# Patient Record
Sex: Female | Born: 1975 | Race: White | Hispanic: No | Marital: Married | Smoking: Former smoker
Health system: Southern US, Community
[De-identification: ages and names within clinical notes are randomized; demographics above are authoritative.]

## PROBLEM LIST (undated history)

## (undated) DIAGNOSIS — Z789 Other specified health status: Secondary | ICD-10-CM

## (undated) HISTORY — PX: OOPHORECTOMY: SHX86

## (undated) HISTORY — PX: PARTIAL HYSTERECTOMY: SHX80

---

## 2005-03-15 ENCOUNTER — Emergency Department: Payer: Self-pay | Admitting: Emergency Medicine

## 2005-07-24 ENCOUNTER — Observation Stay: Payer: Self-pay

## 2005-07-30 ENCOUNTER — Inpatient Hospital Stay: Payer: Self-pay

## 2006-01-07 ENCOUNTER — Emergency Department: Payer: Self-pay | Admitting: Emergency Medicine

## 2008-04-27 ENCOUNTER — Emergency Department: Payer: Self-pay | Admitting: Unknown Physician Specialty

## 2008-08-20 ENCOUNTER — Emergency Department: Payer: Self-pay | Admitting: Emergency Medicine

## 2008-08-29 ENCOUNTER — Ambulatory Visit: Payer: Self-pay | Admitting: Obstetrics and Gynecology

## 2008-09-04 ENCOUNTER — Ambulatory Visit: Payer: Self-pay | Admitting: Obstetrics and Gynecology

## 2008-11-26 ENCOUNTER — Emergency Department: Payer: Self-pay | Admitting: Emergency Medicine

## 2011-02-12 ENCOUNTER — Emergency Department: Payer: Self-pay | Admitting: Emergency Medicine

## 2012-08-11 ENCOUNTER — Emergency Department: Payer: Self-pay | Admitting: Emergency Medicine

## 2016-08-08 ENCOUNTER — Encounter (HOSPITAL_COMMUNITY): Payer: Self-pay | Admitting: Emergency Medicine

## 2016-08-08 ENCOUNTER — Emergency Department (HOSPITAL_COMMUNITY): Payer: No Typology Code available for payment source

## 2016-08-08 ENCOUNTER — Emergency Department (HOSPITAL_COMMUNITY)
Admission: EM | Admit: 2016-08-08 | Discharge: 2016-08-08 | Disposition: A | Discharge status: Home / Self Care | Payer: No Typology Code available for payment source | Attending: Emergency Medicine | Admitting: Emergency Medicine

## 2016-08-08 DIAGNOSIS — S0990XA Unspecified injury of head, initial encounter: Secondary | ICD-10-CM | POA: Diagnosis not present

## 2016-08-08 DIAGNOSIS — Y92410 Unspecified street and highway as the place of occurrence of the external cause: Secondary | ICD-10-CM | POA: Insufficient documentation

## 2016-08-08 DIAGNOSIS — S299XXA Unspecified injury of thorax, initial encounter: Secondary | ICD-10-CM | POA: Insufficient documentation

## 2016-08-08 DIAGNOSIS — Y999 Unspecified external cause status: Secondary | ICD-10-CM | POA: Diagnosis not present

## 2016-08-08 DIAGNOSIS — Y939 Activity, unspecified: Secondary | ICD-10-CM | POA: Insufficient documentation

## 2016-08-08 DIAGNOSIS — F17200 Nicotine dependence, unspecified, uncomplicated: Secondary | ICD-10-CM | POA: Insufficient documentation

## 2016-08-08 DIAGNOSIS — N9489 Other specified conditions associated with female genital organs and menstrual cycle: Secondary | ICD-10-CM | POA: Insufficient documentation

## 2016-08-08 DIAGNOSIS — S3991XA Unspecified injury of abdomen, initial encounter: Secondary | ICD-10-CM | POA: Diagnosis not present

## 2016-08-08 DIAGNOSIS — S298XXA Other specified injuries of thorax, initial encounter: Secondary | ICD-10-CM

## 2016-08-08 LAB — CBC WITH DIFFERENTIAL/PLATELET
Basophils Absolute: 0 10*3/uL (ref 0.0–0.1)
Basophils Relative: 0 %
EOS PCT: 4 %
Eosinophils Absolute: 0.5 10*3/uL (ref 0.0–0.7)
HEMATOCRIT: 45.8 % (ref 36.0–46.0)
HEMOGLOBIN: 15.4 g/dL — AB (ref 12.0–15.0)
LYMPHS ABS: 4.5 10*3/uL — AB (ref 0.7–4.0)
LYMPHS PCT: 37 %
MCH: 32.6 pg (ref 26.0–34.0)
MCHC: 33.6 g/dL (ref 30.0–36.0)
MCV: 97 fL (ref 78.0–100.0)
MONOS PCT: 6 %
Monocytes Absolute: 0.7 10*3/uL (ref 0.1–1.0)
Neutro Abs: 6.5 10*3/uL (ref 1.7–7.7)
Neutrophils Relative %: 53 %
PLATELETS: 360 10*3/uL (ref 150–400)
RBC: 4.72 MIL/uL (ref 3.87–5.11)
RDW: 14 % (ref 11.5–15.5)
WBC: 12.2 10*3/uL — AB (ref 4.0–10.5)

## 2016-08-08 LAB — COMPREHENSIVE METABOLIC PANEL
ALK PHOS: 86 U/L (ref 38–126)
ALT: 21 U/L (ref 14–54)
AST: 23 U/L (ref 15–41)
Albumin: 4 g/dL (ref 3.5–5.0)
Anion gap: 11 (ref 5–15)
BILIRUBIN TOTAL: 0.4 mg/dL (ref 0.3–1.2)
CALCIUM: 9.3 mg/dL (ref 8.9–10.3)
CO2: 21 mmol/L — ABNORMAL LOW (ref 22–32)
CREATININE: 0.49 mg/dL (ref 0.44–1.00)
Chloride: 104 mmol/L (ref 101–111)
Glucose, Bld: 102 mg/dL — ABNORMAL HIGH (ref 65–99)
Potassium: 3.5 mmol/L (ref 3.5–5.1)
Sodium: 136 mmol/L (ref 135–145)
Total Protein: 6.9 g/dL (ref 6.5–8.1)

## 2016-08-08 LAB — I-STAT BETA HCG BLOOD, ED (MC, WL, AP ONLY)

## 2016-08-08 MED ORDER — IOPAMIDOL (ISOVUE-300) INJECTION 61%
INTRAVENOUS | Status: AC
  Administered 2016-08-08: 100 mL
  Filled 2016-08-08: qty 100

## 2016-08-08 MED ORDER — NAPROXEN 375 MG PO TABS: 375.0000 mg | ORAL_TABLET | Freq: Two times a day (BID) | ORAL | 0 refills | Status: DC

## 2016-08-08 MED ORDER — HYDROMORPHONE HCL 1 MG/ML IJ SOLN
1.0000 mg | Freq: Once | INTRAMUSCULAR | Status: AC
  Administered 2016-08-08: 1 mg via INTRAVENOUS
  Filled 2016-08-08: qty 1

## 2016-08-08 MED ORDER — HYDROCODONE-ACETAMINOPHEN 5-325 MG PO TABS: 1.0000 | ORAL_TABLET | ORAL | 0 refills | Status: DC | PRN

## 2016-08-08 MED ORDER — ONDANSETRON HCL 4 MG/2ML IJ SOLN
4.0000 mg | Freq: Once | INTRAMUSCULAR | Status: AC
  Administered 2016-08-08: 4 mg via INTRAVENOUS
  Filled 2016-08-08: qty 2

## 2016-08-08 MED ORDER — SODIUM CHLORIDE 0.9 % IV BOLUS (SEPSIS)
1000.0000 mL | Freq: Once | INTRAVENOUS | Status: AC
  Administered 2016-08-08: 1000 mL via INTRAVENOUS

## 2016-08-08 MED ORDER — CYCLOBENZAPRINE HCL 10 MG PO TABS: 10.0000 mg | ORAL_TABLET | Freq: Three times a day (TID) | ORAL | 0 refills | Status: DC | PRN

## 2016-08-08 NOTE — ED Notes (Signed)
Discharge instructions and prescriptions reviewed 

## 2016-08-08 NOTE — ED Notes (Signed)
Nurse drawing labs. 
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
## 2016-08-08 NOTE — ED Notes (Signed)
Pt transported to CT ?

## 2017-05-15 ENCOUNTER — Encounter: Payer: Self-pay | Admitting: Emergency Medicine

## 2017-05-15 ENCOUNTER — Emergency Department
Admission: EM | Admit: 2017-05-15 | Discharge: 2017-05-16 | Disposition: A | Discharge status: Home / Self Care | Payer: Self-pay | Attending: Emergency Medicine | Admitting: Emergency Medicine

## 2017-05-15 DIAGNOSIS — Z23 Encounter for immunization: Secondary | ICD-10-CM | POA: Insufficient documentation

## 2017-05-15 DIAGNOSIS — Z79899 Other long term (current) drug therapy: Secondary | ICD-10-CM | POA: Insufficient documentation

## 2017-05-15 DIAGNOSIS — Y929 Unspecified place or not applicable: Secondary | ICD-10-CM | POA: Insufficient documentation

## 2017-05-15 DIAGNOSIS — W25XXXA Contact with sharp glass, initial encounter: Secondary | ICD-10-CM | POA: Insufficient documentation

## 2017-05-15 DIAGNOSIS — Y9389 Activity, other specified: Secondary | ICD-10-CM | POA: Insufficient documentation

## 2017-05-15 DIAGNOSIS — S61012A Laceration without foreign body of left thumb without damage to nail, initial encounter: Secondary | ICD-10-CM | POA: Insufficient documentation

## 2017-05-15 DIAGNOSIS — Y999 Unspecified external cause status: Secondary | ICD-10-CM | POA: Insufficient documentation

## 2017-05-15 NOTE — ED Triage Notes (Signed)
Pt in via POV with laceration to base of left thumb, pt reports she was washing dishes, cutting finger on broken glass.  Bleeding controlled at this time.  NAD noted at this time.

## 2017-05-16 MED ORDER — TETANUS-DIPHTH-ACELL PERTUSSIS 5-2.5-18.5 LF-MCG/0.5 IM SUSP
0.5000 mL | Freq: Once | INTRAMUSCULAR | Status: AC
  Administered 2017-05-16: 0.5 mL via INTRAMUSCULAR
  Filled 2017-05-16: qty 0.5

## 2017-05-16 NOTE — ED Provider Notes (Signed)
[facility] Emergency Department Provider Note   ____________________________________________   First MD Initiated Contact with Patient 05/15/17 2350     (approximate)  I have reviewed the triage vital signs and the nursing notes.   HISTORY  Chief Complaint Extremity Laceration    HPI Deborah Carney is a 41 y.o. female who came into the hospital today with a laceration to her thumb. The patient reports that she was washing dishes and the glass broke while it was in her hand. It cut the base of her left thumb on the dorsal side. The patient is unsure of her last tetanus. She states that it would not stop bleeding. The patient rates her pain 8-10 out of 10 in intensity. She didn't take anything for pain and she came in for repair.   History reviewed. No pertinent past medical history.  There are no active problems to display for this patient.   Past Surgical History:  Procedure Laterality Date  . OOPHORECTOMY Left     Prior to Admission medications   Medication Sig Start Date End Date Taking? Authorizing Provider  cyclobenzaprine (FLEXERIL) 10 MG tablet Take 1 tablet (10 mg total) by mouth 3 (three) times daily as needed for muscle spasms. 08/08/16   Gilda Crease, MD  HYDROcodone-acetaminophen (NORCO/VICODIN) 5-325 MG tablet Take 1-2 tablets by mouth every 4 (four) hours as needed for moderate pain. 08/08/16   Gilda Crease, MD  naproxen (NAPROSYN) 375 MG tablet Take 1 tablet (375 mg total) by mouth 2 (two) times daily. 08/08/16   Gilda Crease, MD    Allergies Patient has no known allergies.  No family history on file.  Social History Social History  Substance Use Topics  . Smoking status: Current Some Day Smoker  . Smokeless tobacco: Never Used  . Alcohol use No    Review of Systems  Constitutional: No fever/chills Eyes: No visual changes. ENT: No sore throat. Cardiovascular: Denies chest pain. Respiratory:  Denies shortness of breath. Gastrointestinal: No abdominal pain.  No nausea, no vomiting.  No diarrhea.  No constipation. Genitourinary: Negative for dysuria. Musculoskeletal: Negative for back pain. Skin: Laceration to the base of left thumb Neurological: Negative for headaches, focal weakness or numbness.   ____________________________________________   PHYSICAL EXAM:  VITAL SIGNS: ED Triage Vitals  Enc Vitals Group     BP 05/15/17 2128 110/69     Pulse Rate 05/15/17 2128 92     Resp 05/15/17 2128 16     Temp 05/15/17 2128 98.3 F (36.8 C)     Temp Source 05/15/17 2128 Oral     SpO2 05/15/17 2128 100 %     Weight 05/15/17 2129 132 lb (59.9 kg)     Height 05/15/17 2129 5\' 2"  (1.575 m)     Head Circumference --      Peak Flow --      Pain Score 05/15/17 2127 6     Pain Loc --      Pain Edu? --      Excl. in GC? --     Constitutional: Alert and oriented. Well appearing and in Mild distress. Eyes: Conjunctivae are normal. PERRL. EOMI. Head: Atraumatic. Nose: No congestion/rhinnorhea. Mouth/Throat: Mucous membranes are moist.  Oropharynx non-erythematous. Cardiovascular: Normal rate, regular rhythm. Grossly normal heart sounds.  Good peripheral circulation. Respiratory: Normal respiratory effort.  No retractions. Lungs CTAB. Gastrointestinal: Soft and nontender. No distention. Positive bowel sounds. Musculoskeletal: No lower extremity tenderness nor edema.  Neurologic:  Normal speech and language.  Skin:  Skin is warm, dry 2.5 cm laceration to the base of the dorsal left thumb. Laceration is a flap with the edges that approximate well and cover the wound. No active bleeding at this time, no erythema, the patient has good capillary refill of her thumb and has good motion of her thumb.Marland Kitchen. Psychiatric: Mood and affect are normal.   ____________________________________________   LABS (all labs ordered are listed, but only abnormal results are displayed)  Labs Reviewed - No  data to display ____________________________________________  EKG  none ____________________________________________  RADIOLOGY  No results found.  ____________________________________________   PROCEDURES  Procedure(s) performed: please, see procedure note(s).  Marland Kitchen..Laceration Repair Date/Time: 05/16/2017 12:28 AM Performed by: Rebecka ApleyWEBSTER, ALLISON P Authorized by: Rebecka ApleyWEBSTER, ALLISON P   Consent:    Consent obtained:  Verbal   Consent given by:  Patient Anesthesia (see MAR for exact dosages):    Anesthesia method:  None Laceration details:    Location:  Finger   Finger location:  L thumb   Length (cm):  2 Repair type:    Repair type:  Simple Exploration:    Contaminated: no   Treatment:    Area cleansed with:  Soap and water   Amount of cleaning:  Standard Skin repair:    Repair method:  Tissue adhesive Post-procedure details:    Dressing:  Bulky dressing   Patient tolerance of procedure:  Tolerated well, no immediate complications    Critical Care performed: No  ____________________________________________   INITIAL IMPRESSION / ASSESSMENT AND PLAN / ED COURSE  Pertinent labs & imaging results that were available during my care of the patient were reviewed by me and considered in my medical decision making (see chart for details).  This is a 41 year old female who comes into the hospital today with a laceration to the base of her left thumb. The wound is a flap and the edges are approximated well. I will choose to glue the wound as it was still remained closed and should heal well. After putting Dermabond on the wound I did place a splint out to the patient's finger so she cannot bend it while it is healing. I instructed the patient that should she have any swelling any drainage or any worsening condition she should return to the emergency department. The patient be discharged home to follow-up with her primary care physician.       ____________________________________________   FINAL CLINICAL IMPRESSION(S) / ED DIAGNOSES  Final diagnoses:  Laceration of left thumb without foreign body without damage to nail, initial encounter      NEW MEDICATIONS STARTED DURING THIS VISIT:  Discharge Medication List as of 05/16/2017 12:37 AM       Note:  This document was prepared using Dragon voice recognition software and may include unintentional dictation errors.    Rebecka ApleyWebster, Allison P, MD 05/16/17 [phone]

## 2017-05-16 NOTE — Discharge Instructions (Signed)
Please follow up with your primary care physician. Please return with any worsened condition or any other concern

## 2017-09-07 ENCOUNTER — Emergency Department: Payer: BLUE CROSS/BLUE SHIELD

## 2017-09-07 ENCOUNTER — Emergency Department
Admission: EM | Admit: 2017-09-07 | Discharge: 2017-09-07 | Disposition: A | Discharge status: Home / Self Care | Payer: BLUE CROSS/BLUE SHIELD | Attending: Emergency Medicine | Admitting: Emergency Medicine

## 2017-09-07 ENCOUNTER — Other Ambulatory Visit: Payer: Self-pay

## 2017-09-07 DIAGNOSIS — Z87891 Personal history of nicotine dependence: Secondary | ICD-10-CM | POA: Insufficient documentation

## 2017-09-07 DIAGNOSIS — J181 Lobar pneumonia, unspecified organism: Secondary | ICD-10-CM | POA: Insufficient documentation

## 2017-09-07 DIAGNOSIS — J189 Pneumonia, unspecified organism: Secondary | ICD-10-CM

## 2017-09-07 DIAGNOSIS — R0600 Dyspnea, unspecified: Secondary | ICD-10-CM | POA: Diagnosis present

## 2017-09-07 LAB — CBC WITH DIFFERENTIAL/PLATELET
BASOS PCT: 1 %
Basophils Absolute: 0.1 10*3/uL (ref 0–0.1)
Eosinophils Absolute: 1.1 10*3/uL — ABNORMAL HIGH (ref 0–0.7)
Eosinophils Relative: 7 %
HCT: 45.3 % (ref 35.0–47.0)
Hemoglobin: 15.1 g/dL (ref 12.0–16.0)
Lymphocytes Relative: 21 %
Lymphs Abs: 3.4 10*3/uL (ref 1.0–3.6)
MCH: 32.2 pg (ref 26.0–34.0)
MCHC: 33.4 g/dL (ref 32.0–36.0)
MCV: 96.3 fL (ref 80.0–100.0)
MONO ABS: 0.8 10*3/uL (ref 0.2–0.9)
MONOS PCT: 5 %
NEUTROS PCT: 66 %
Neutro Abs: 10.9 10*3/uL — ABNORMAL HIGH (ref 1.4–6.5)
Platelets: 370 10*3/uL (ref 150–440)
RBC: 4.7 MIL/uL (ref 3.80–5.20)
RDW: 14.6 % — AB (ref 11.5–14.5)
WBC: 16.3 10*3/uL — ABNORMAL HIGH (ref 3.6–11.0)

## 2017-09-07 LAB — COMPREHENSIVE METABOLIC PANEL
ALBUMIN: 4.1 g/dL (ref 3.5–5.0)
ALT: 21 U/L (ref 14–54)
AST: 27 U/L (ref 15–41)
Alkaline Phosphatase: 93 U/L (ref 38–126)
Anion gap: 10 (ref 5–15)
CHLORIDE: 103 mmol/L (ref 101–111)
CO2: 22 mmol/L (ref 22–32)
CREATININE: 0.58 mg/dL (ref 0.44–1.00)
Calcium: 8.9 mg/dL (ref 8.9–10.3)
GFR calc Af Amer: >60 mL/min (ref >60)
GFR calc non Af Amer: >60 mL/min (ref >60)
GLUCOSE: 113 mg/dL — AB (ref 65–99)
POTASSIUM: 3.2 mmol/L — AB (ref 3.5–5.1)
SODIUM: 135 mmol/L (ref 135–145)
Total Bilirubin: 0.7 mg/dL (ref 0.3–1.2)
Total Protein: 7.9 g/dL (ref 6.5–8.1)

## 2017-09-07 LAB — TROPONIN I: Troponin I: <0.03 ng/mL (ref <0.03)

## 2017-09-07 LAB — FIBRIN DERIVATIVES D-DIMER (ARMC ONLY): Fibrin derivatives D-dimer (ARMC): 558.23 ng/mL (FEU) — ABNORMAL HIGH (ref 0.00–499.00)

## 2017-09-07 LAB — BRAIN NATRIURETIC PEPTIDE: B NATRIURETIC PEPTIDE 5: 10 pg/mL (ref 0.0–100.0)

## 2017-09-07 MED ORDER — DOXYCYCLINE HYCLATE 100 MG PO TABS
ORAL_TABLET | ORAL | Status: AC
  Administered 2017-09-07: 100 mg via ORAL
  Filled 2017-09-07: qty 1

## 2017-09-07 MED ORDER — IPRATROPIUM-ALBUTEROL 0.5-2.5 (3) MG/3ML IN SOLN
RESPIRATORY_TRACT | Status: AC
  Administered 2017-09-07: 3 mL via RESPIRATORY_TRACT
  Filled 2017-09-07: qty 3

## 2017-09-07 MED ORDER — DEXAMETHASONE SODIUM PHOSPHATE 10 MG/ML IJ SOLN
10.0000 mg | Freq: Once | INTRAMUSCULAR | Status: AC
  Administered 2017-09-07: 10 mg via INTRAVENOUS
  Filled 2017-09-07: qty 1

## 2017-09-07 MED ORDER — ALBUTEROL SULFATE HFA 108 (90 BASE) MCG/ACT IN AERS: INHALATION_SPRAY | RESPIRATORY_TRACT | 1 refills | Status: DC

## 2017-09-07 MED ORDER — IPRATROPIUM-ALBUTEROL 0.5-2.5 (3) MG/3ML IN SOLN
3.0000 mL | Freq: Once | RESPIRATORY_TRACT | Status: AC
  Administered 2017-09-07: 3 mL via RESPIRATORY_TRACT
  Filled 2017-09-07: qty 3

## 2017-09-07 MED ORDER — DOXYCYCLINE HYCLATE 100 MG PO CAPS: ORAL_CAPSULE | ORAL | 0 refills | Status: DC

## 2017-09-07 MED ORDER — DOXYCYCLINE HYCLATE 100 MG PO TABS
100.0000 mg | ORAL_TABLET | Freq: Once | ORAL | Status: AC
  Administered 2017-09-07: 100 mg via ORAL

## 2017-09-07 MED ORDER — IPRATROPIUM-ALBUTEROL 0.5-2.5 (3) MG/3ML IN SOLN
RESPIRATORY_TRACT | Status: AC
  Filled 2017-09-07: qty 3

## 2017-09-07 MED ORDER — IOPAMIDOL (ISOVUE-370) INJECTION 76%
75.0000 mL | Freq: Once | INTRAVENOUS | Status: AC | PRN
  Administered 2017-09-07: 75 mL via INTRAVENOUS
  Filled 2017-09-07: qty 75

## 2017-09-07 MED ORDER — IPRATROPIUM-ALBUTEROL 0.5-2.5 (3) MG/3ML IN SOLN
3.0000 mL | Freq: Once | RESPIRATORY_TRACT | Status: AC
  Administered 2017-09-07: 3 mL via RESPIRATORY_TRACT

## 2017-09-07 MED ORDER — DEXAMETHASONE SODIUM PHOSPHATE 10 MG/ML IJ SOLN
INTRAMUSCULAR | Status: AC
  Administered 2017-09-07: 10 mg via INTRAVENOUS
  Filled 2017-09-07: qty 1

## 2017-09-07 NOTE — ED Provider Notes (Signed)
[facility] Emergency Department Provider Note  ____________________________________________   First MD Initiated Contact with Patient 09/07/17 1658     (approximate)  I have reviewed the triage vital signs and the nursing notes.   HISTORY  Chief Complaint URI; Cough; and Shortness of Breath    HPI Deborah Carney is a 41 y.o. female who denies any chronic medical history who presents for evaluation of difficulty breathing and chest pain for about 5 days.  She states that she feels like she cannot take a deep breath and it is much worse with exertion.  She has had some dry cough as well and some nasal congestion but the symptoms started with the shortness of breath.  No matter what she does she feels like she cannot get a full breath.  She has never experienced this in the past.  Exertion makes it worse, nothing in particular makes it better.  She denies fever/chills, nausea, vomiting, abdominal pain, dysuria.  She is not on exogenous estrogen, has not been immobilized recently, no recent surgeries, and has no history of blood clots in her legs nor lungs.  No recent leg pain nor swelling.  She describes her symptoms as severe.  History reviewed. No pertinent past medical history.  There are no active problems to display for this patient.   Past Surgical History:  Procedure Laterality Date  . OOPHORECTOMY Left     Prior to Admission medications   Medication Sig Start Date End Date Taking? Authorizing Provider  albuterol (PROVENTIL HFA;VENTOLIN HFA) 108 (90 Base) MCG/ACT inhaler Inhale 2-4 puffs by mouth every 4 hours as needed for wheezing, cough, and/or shortness of breath 09/07/17   Loleta Rose, MD  cyclobenzaprine (FLEXERIL) 10 MG tablet Take 1 tablet (10 mg total) by mouth 3 (three) times daily as needed for muscle spasms. 08/08/16   Gilda Crease, MD  doxycycline (VIBRAMYCIN) 100 MG capsule Take 1 capsule (100 mg) by mouth twice daily for 10  days. 09/07/17   Loleta Rose, MD  HYDROcodone-acetaminophen (NORCO/VICODIN) 5-325 MG tablet Take 1-2 tablets by mouth every 4 (four) hours as needed for moderate pain. 08/08/16   Gilda Crease, MD  naproxen (NAPROSYN) 375 MG tablet Take 1 tablet (375 mg total) by mouth 2 (two) times daily. 08/08/16   Gilda Crease, MD    Allergies Patient has no known allergies.  History reviewed. No pertinent family history.  Social History Social History   Tobacco Use  . Smoking status: Former Smoker    Last attempt to quit: 2014    Years since quitting: 4.8  . Smokeless tobacco: Never Used  Substance Use Topics  . Alcohol use: No  . Drug use: No    Review of Systems Constitutional: No fever/chills Eyes: No visual changes. ENT: No sore throat. Cardiovascular: +chest pain with deep inspiration Respiratory: +shortness of breath and nonproductive cough Gastrointestinal: No abdominal pain.  No nausea, no vomiting.  No diarrhea.  No constipation. Genitourinary: Negative for dysuria. Musculoskeletal: Negative for neck pain.  Negative for back pain. Integumentary: Negative for rash. Neurological: Negative for headaches, focal weakness or numbness.   ____________________________________________   PHYSICAL EXAM:  VITAL SIGNS: ED Triage Vitals  Enc Vitals Group     BP 09/07/17 1349 (!) 164/99     Pulse Rate 09/07/17 1349 (!) 104     Resp 09/07/17 1349 20     Temp 09/07/17 1349 98.1 F (36.7 C)     Temp Source 09/07/17 1349  Oral     SpO2 09/07/17 1349 97 %     Weight 09/07/17 1349 59.9 kg (132 lb)     Height 09/07/17 1349 1.575 m (5\' 2" )     Head Circumference --      Peak Flow --      Pain Score 09/07/17 1348 6     Pain Loc --      Pain Edu? --      Excl. in GC? --     Constitutional: Alert and oriented. Well appearing and in no acute distress. Eyes: Conjunctivae are normal.  Head: Atraumatic. Nose: No congestion/rhinnorhea. Mouth/Throat: Mucous membranes are  moist. Neck: No stridor.  No meningeal signs.   Cardiovascular: Normal rate, regular rhythm. Good peripheral circulation. Grossly normal heart sounds. Respiratory: Normal respiratory effort.  No retractions.  Mild end expiratory wheezing. Gastrointestinal: Soft and nontender. No distention.  Musculoskeletal: No lower extremity tenderness nor edema. No gross deformities of extremities. Neurologic:  Normal speech and language. No gross focal neurologic deficits are appreciated.  Skin:  Skin is warm, dry and intact. No rash noted. Psychiatric: Mood and affect are normal. Speech and behavior are normal.  ____________________________________________   LABS (all labs ordered are listed, but only abnormal results are displayed)  Labs Reviewed  FIBRIN DERIVATIVES D-DIMER ([facility] ONLY) - Abnormal; Notable for the following components:      Result Value   Fibrin derivatives D-dimer [facility]) 558.23 (*)    All other components within normal limits  CBC WITH DIFFERENTIAL/PLATELET - Abnormal; Notable for the following components:   WBC 16.3 (*)    RDW 14.6 (*)    Neutro Abs 10.9 (*)    Eosinophils Absolute 1.1 (*)    All other components within normal limits  COMPREHENSIVE METABOLIC PANEL - Abnormal; Notable for the following components:   Potassium 3.2 (*)    Glucose, Bld 113 (*)    BUN <5 (*)    All other components within normal limits  BRAIN NATRIURETIC PEPTIDE  TROPONIN I   ____________________________________________  EKG  ED ECG REPORT I, Jaime Dome, the attending physician, personally viewed and interpreted this ECG.  Date: 09/07/2017 EKG Time: 13: 50 Rate: 104 Rhythm: Mild sinus tachycardia QRS Axis: normal Intervals: normal ST/T Wave abnormalities: Non-specific ST segment / T-wave changes, but no evidence of acute ischemia. Narrative Interpretation: no evidence of acute ischemia   ____________________________________________  RADIOLOGY   Dg Chest 2 View  Result  Date: 09/07/2017 CLINICAL DATA:  Shortness of breath EXAM: CHEST  2 VIEW COMPARISON:  08/28/2016 FINDINGS: Hyperinflation. Bronchitic markings. There is no edema, consolidation, effusion, or pneumothorax. Normal heart size and mediastinal contours. No acute osseous finding. IMPRESSION: 1. No evidence of active disease. 2. Mild hyperinflation. Electronically Signed   By: Marnee Spring M.D.   On: 09/07/2017 14:22   Ct Angio Chest Pe W/cm &/or Wo Cm  Result Date: 09/07/2017 CLINICAL DATA:  Chest pain and shortness of breath with positive D-dimer. EXAM: CT ANGIOGRAPHY CHEST WITH CONTRAST TECHNIQUE: Multidetector CT imaging of the chest was performed using the standard protocol during bolus administration of intravenous contrast. Multiplanar CT image reconstructions and MIPs were obtained to evaluate the vascular anatomy. CONTRAST:  75 mL Isovue 370 COMPARISON:  Chest radiograph 09/07/2017 Chest CT 08/08/2016 FINDINGS: Cardiovascular: Contrast injection is sufficient to demonstrate satisfactory opacification of the pulmonary arteries to the segmental level. There is no pulmonary embolus. The main pulmonary artery is within normal limits for size. There is no CT evidence of  acute right heart strain. The visualized aorta is normal. There is a normal 3-vessel arch branching pattern. Heart size is normal, without pericardial effusion. Mediastinum/Nodes: No mediastinal, hilar or axillary lymphadenopathy. The visualized thyroid and thoracic esophageal course are unremarkable. Lungs/Pleura: Scattered bilateral, upper lobe predominant ground-glass opacities. No pleural effusion or pneumothorax. No pulmonary nodules or masses. Airways are patent. Upper Abdomen: Contrast bolus timing is not optimized for evaluation of the abdominal organs. Within this limitation, the visualized organs of the upper abdomen are normal. Musculoskeletal: No chest wall abnormality. No acute or significant osseous findings. Review of the MIP  images confirms the above findings. IMPRESSION: 1. No pulmonary embolus or acute aortic syndrome. 2. Scattered bilateral, upper lobe predominant ground-glass opacities, concerning for multifocal pneumonia. Electronically Signed   By: Deatra Robinson M.D.   On: 09/07/2017 19:54    ____________________________________________   PROCEDURES  Critical Care performed: No   Procedure(s) performed:   Procedures   ____________________________________________   INITIAL IMPRESSION / ASSESSMENT AND PLAN / ED COURSE  As part of my medical decision making, I reviewed the following data within the electronic MEDICAL RECORD NUMBER Labs reviewed , EKG interpreted , Old chart reviewed and Radiograph reviewed     Differential includes, but is not limited to, viral syndrome, bronchitis including COPD exacerbation, pneumonia, reactive airway disease including asthma, CHF including exacerbation with or without pulmonary/interstitial edema, pneumothorax, ACS, thoracic trauma, and pulmonary embolism.  The patient cannot be ruled out for PE because of some mild tachycardia although it may be related to both anxiety and exertion.  She is low to intermediate risk for pulmonary embolism simply based on symptoms and lack of other explanation.  Most likely she has a viral bronchitis but given the severity of the shortness of breath she describes I discussed with her obtaining lab work and attempting to rule out PE with a d-dimer.  She understands and agrees with the plan.  No evidence of acute abnormality on chest x-ray.  Labs are pending.   Clinical Course as of Sep 07 2056  Mon Sep 07, 2017  1857 Slightly elevated d-dimer.  Patient still feels short of breath.  I had a usual and customary risks/benefits discussion about the d-dimer and CTA chest and we are going to proceed with the study which should give [place] a good look not only at the vasculature but to make sure she does not have any interstitial pneumonia or  interstitial edema that is not visible on the regular chest x-ray. Fibrin derivatives D-dimer [facility]): (!) 558.23 [CF]  1934 Moderate leukocytosis.  Troponin is negative, BNP is within normal limits, potassium is slightly decreased but otherwise CMP is within normal limits WBC: (!) 16.3 [CF]  2021 No pulmonary embolism, but consistent with multifocal community-acquired pneumonia.  Vital signs are essentially stable and this also explains the leukocytosis.  She does not have tachycardia rest and she is tolerating p.o. intake in the ED.  I updated her with the results and will treat her with the first dose of doxycycline in the ED and a prescription as well as a prescription for an albuterol inhaler.  I told her it is very important that she complete the full course of treatment.  I gave my usual and customary return precautions. CT Angio Chest PE W/Cm &/Or Wo Cm [CF]    Clinical Course User Index [CF] Loleta Rose, MD    ____________________________________________  FINAL CLINICAL IMPRESSION(S) / ED DIAGNOSES  Final diagnoses:  Pneumonia of both upper  lobes due to infectious organism [facility](HCC)     MEDICATIONS GIVEN DURING THIS VISIT:  Medications  ipratropium-albuterol (DUONEB) 0.5-2.5 (3) MG/3ML nebulizer solution 3 mL ( Nebulization Not Given 09/07/17 1600)  dexamethasone (DECADRON) injection 10 mg (10 mg Intravenous Given 09/07/17 1823)  ipratropium-albuterol (DUONEB) 0.5-2.5 (3) MG/3ML nebulizer solution 3 mL (3 mLs Nebulization Given 09/07/17 1823)  iopamidol (ISOVUE-370) 76 % injection 75 mL (75 mLs Intravenous Contrast Given 09/07/17 1918)  doxycycline (VIBRA-TABS) tablet 100 mg (100 mg Oral Given 09/07/17 2032)       Note:  This document was prepared using Dragon voice recognition software and may include unintentional dictation errors.    Loleta RoseForbach, Blakelynn Scheeler, MD 09/07/17 [phone]

## 2017-09-07 NOTE — ED Notes (Signed)
Patient denies pain and is resting comfortably.  

## 2017-09-07 NOTE — ED Triage Notes (Addendum)
Pt reports CP X 5 days, SOB and URI sx X 4 days. CP and rib pain with coughing. Has not seen PCP. Pt becomes SOB upon exertion Nonproductive cough.

## 2017-09-07 NOTE — Discharge Instructions (Signed)

## 2017-09-07 NOTE — ED Notes (Signed)
Duoneb completed.  Lung fields remain diminished, however air entry and exchange much improved.  NO SOB/ DOE.  Continue to monitor.

## 2017-09-18 ENCOUNTER — Inpatient Hospital Stay
Admission: EM | Admit: 2017-09-18 | Discharge: 2017-09-21 | DRG: 871 | Disposition: A | Discharge status: Home / Self Care | Payer: BLUE CROSS/BLUE SHIELD | Attending: Internal Medicine | Admitting: Internal Medicine

## 2017-09-18 ENCOUNTER — Emergency Department: Payer: BLUE CROSS/BLUE SHIELD

## 2017-09-18 ENCOUNTER — Other Ambulatory Visit: Payer: Self-pay

## 2017-09-18 ENCOUNTER — Encounter: Payer: Self-pay | Admitting: Emergency Medicine

## 2017-09-18 DIAGNOSIS — A419 Sepsis, unspecified organism: Secondary | ICD-10-CM | POA: Diagnosis not present

## 2017-09-18 DIAGNOSIS — R51 Headache: Secondary | ICD-10-CM | POA: Diagnosis present

## 2017-09-18 DIAGNOSIS — J9601 Acute respiratory failure with hypoxia: Secondary | ICD-10-CM | POA: Diagnosis present

## 2017-09-18 DIAGNOSIS — Z87891 Personal history of nicotine dependence: Secondary | ICD-10-CM

## 2017-09-18 DIAGNOSIS — Z79899 Other long term (current) drug therapy: Secondary | ICD-10-CM | POA: Diagnosis not present

## 2017-09-18 DIAGNOSIS — J440 Chronic obstructive pulmonary disease with acute lower respiratory infection: Secondary | ICD-10-CM | POA: Diagnosis present

## 2017-09-18 DIAGNOSIS — J189 Pneumonia, unspecified organism: Secondary | ICD-10-CM | POA: Diagnosis present

## 2017-09-18 DIAGNOSIS — R0603 Acute respiratory distress: Secondary | ICD-10-CM

## 2017-09-18 DIAGNOSIS — J181 Lobar pneumonia, unspecified organism: Secondary | ICD-10-CM

## 2017-09-18 DIAGNOSIS — E876 Hypokalemia: Secondary | ICD-10-CM | POA: Diagnosis present

## 2017-09-18 HISTORY — DX: Other specified health status: Z78.9

## 2017-09-18 LAB — CBC WITH DIFFERENTIAL/PLATELET
BASOS ABS: 0.1 10*3/uL (ref 0–0.1)
BASOS PCT: 1 %
EOS ABS: 1.3 10*3/uL — AB (ref 0–0.7)
EOS PCT: 11 %
HCT: 45.3 % (ref 35.0–47.0)
Hemoglobin: 15.3 g/dL (ref 12.0–16.0)
LYMPHS PCT: 21 %
Lymphs Abs: 2.4 10*3/uL (ref 1.0–3.6)
MCH: 32.1 pg (ref 26.0–34.0)
MCHC: 33.8 g/dL (ref 32.0–36.0)
MCV: 95.2 fL (ref 80.0–100.0)
MONO ABS: 0.5 10*3/uL (ref 0.2–0.9)
Monocytes Relative: 4 %
Neutro Abs: 7.1 10*3/uL — ABNORMAL HIGH (ref 1.4–6.5)
Neutrophils Relative %: 63 %
PLATELETS: 410 10*3/uL (ref 150–440)
RBC: 4.76 MIL/uL (ref 3.80–5.20)
RDW: 14.3 % (ref 11.5–14.5)
WBC: 11.5 10*3/uL — AB (ref 3.6–11.0)

## 2017-09-18 LAB — PROCALCITONIN

## 2017-09-18 LAB — COMPREHENSIVE METABOLIC PANEL
ALBUMIN: 4.1 g/dL (ref 3.5–5.0)
ALT: 26 U/L (ref 14–54)
AST: 28 U/L (ref 15–41)
Alkaline Phosphatase: 98 U/L (ref 38–126)
Anion gap: 9 (ref 5–15)
CHLORIDE: 105 mmol/L (ref 101–111)
CO2: 24 mmol/L (ref 22–32)
CREATININE: 0.47 mg/dL (ref 0.44–1.00)
Calcium: 8.9 mg/dL (ref 8.9–10.3)
GFR calc Af Amer: >60 mL/min (ref >60)
GFR calc non Af Amer: >60 mL/min (ref >60)
GLUCOSE: 127 mg/dL — AB (ref 65–99)
POTASSIUM: 3.1 mmol/L — AB (ref 3.5–5.1)
SODIUM: 138 mmol/L (ref 135–145)
Total Bilirubin: 0.9 mg/dL (ref 0.3–1.2)
Total Protein: 7.7 g/dL (ref 6.5–8.1)

## 2017-09-18 MED ORDER — ONDANSETRON HCL 4 MG/2ML IJ SOLN
4.0000 mg | Freq: Four times a day (QID) | INTRAMUSCULAR | Status: DC | PRN
  Filled 2017-09-18: qty 2

## 2017-09-18 MED ORDER — LORAZEPAM 2 MG/ML IJ SOLN: 0.5000 mg | Freq: Once | INTRAMUSCULAR | Status: DC

## 2017-09-18 MED ORDER — AZITHROMYCIN 500 MG PO TABS
250.0000 mg | ORAL_TABLET | Freq: Every day | ORAL | Status: DC
  Administered 2017-09-19 – 2017-09-21 (×3): 250 mg via ORAL
  Filled 2017-09-18 (×3): qty 1

## 2017-09-18 MED ORDER — CEFTRIAXONE SODIUM IN DEXTROSE 20 MG/ML IV SOLN: 1.0000 g | INTRAVENOUS | Status: DC

## 2017-09-18 MED ORDER — DEXTROSE 5 % IV SOLN
1.0000 g | INTRAVENOUS | Status: DC
  Administered 2017-09-19 – 2017-09-21 (×3): 1 g via INTRAVENOUS
  Filled 2017-09-18 (×3): qty 10

## 2017-09-18 MED ORDER — POTASSIUM CHLORIDE CRYS ER 20 MEQ PO TBCR
40.0000 meq | EXTENDED_RELEASE_TABLET | Freq: Once | ORAL | Status: AC
  Administered 2017-09-18: 17:00:00 40 meq via ORAL
  Filled 2017-09-18: qty 2

## 2017-09-18 MED ORDER — AZITHROMYCIN 500 MG PO TABS
500.0000 mg | ORAL_TABLET | Freq: Once | ORAL | Status: AC
  Administered 2017-09-18: 500 mg via ORAL
  Filled 2017-09-18: qty 1

## 2017-09-18 MED ORDER — ALBUTEROL SULFATE (2.5 MG/3ML) 0.083% IN NEBU
INHALATION_SOLUTION | RESPIRATORY_TRACT | Status: AC
  Administered 2017-09-18: 14:00:00
  Filled 2017-09-18: qty 12

## 2017-09-18 MED ORDER — METHYLPREDNISOLONE SODIUM SUCC 40 MG IJ SOLR
40.0000 mg | Freq: Every day | INTRAMUSCULAR | Status: DC
  Administered 2017-09-19 – 2017-09-21 (×3): 40 mg via INTRAVENOUS
  Filled 2017-09-18 (×3): qty 1

## 2017-09-18 MED ORDER — ONDANSETRON HCL 4 MG PO TABS: 4.0000 mg | ORAL_TABLET | Freq: Four times a day (QID) | ORAL | Status: DC | PRN

## 2017-09-18 MED ORDER — IPRATROPIUM-ALBUTEROL 0.5-2.5 (3) MG/3ML IN SOLN
3.0000 mL | Freq: Four times a day (QID) | RESPIRATORY_TRACT | Status: DC
  Administered 2017-09-18 – 2017-09-20 (×9): 3 mL via RESPIRATORY_TRACT
  Filled 2017-09-18 (×9): qty 3

## 2017-09-18 MED ORDER — ACETAMINOPHEN 325 MG PO TABS
650.0000 mg | ORAL_TABLET | Freq: Four times a day (QID) | ORAL | Status: DC | PRN
  Administered 2017-09-18 – 2017-09-19 (×3): 650 mg via ORAL
  Filled 2017-09-18 (×3): qty 2

## 2017-09-18 MED ORDER — ENOXAPARIN SODIUM 40 MG/0.4ML ~~LOC~~ SOLN
40.0000 mg | SUBCUTANEOUS | Status: DC
  Administered 2017-09-19 – 2017-09-20 (×2): 40 mg via SUBCUTANEOUS
  Filled 2017-09-18 (×2): qty 0.4

## 2017-09-18 MED ORDER — ALBUTEROL SULFATE (2.5 MG/3ML) 0.083% IN NEBU
5.0000 mg | INHALATION_SOLUTION | Freq: Once | RESPIRATORY_TRACT | Status: AC
  Administered 2017-09-18: 5 mg via RESPIRATORY_TRACT
  Filled 2017-09-18: qty 6

## 2017-09-18 MED ORDER — CEFTRIAXONE SODIUM IN DEXTROSE 20 MG/ML IV SOLN
1.0000 g | Freq: Once | INTRAVENOUS | Status: AC
  Administered 2017-09-18: 1 g via INTRAVENOUS
  Filled 2017-09-18: qty 50

## 2017-09-18 MED ORDER — PHENOL 1.4 % MT LIQD
1.0000 | OROMUCOSAL | Status: DC | PRN
  Administered 2017-09-18 – 2017-09-20 (×3): 1 via OROMUCOSAL
  Filled 2017-09-18 (×2): qty 177

## 2017-09-18 MED ORDER — SODIUM CHLORIDE 0.9 % IV SOLN
INTRAVENOUS | Status: DC
  Administered 2017-09-18 – 2017-09-20 (×3): via INTRAVENOUS

## 2017-09-18 MED ORDER — METHYLPREDNISOLONE SODIUM SUCC 125 MG IJ SOLR
125.0000 mg | Freq: Once | INTRAMUSCULAR | Status: AC
  Administered 2017-09-18: 125 mg via INTRAVENOUS
  Filled 2017-09-18: qty 2

## 2017-09-18 MED ORDER — SODIUM CHLORIDE 0.9 % IV BOLUS (SEPSIS)
1000.0000 mL | Freq: Once | INTRAVENOUS | Status: AC
  Administered 2017-09-18: 1000 mL via INTRAVENOUS

## 2017-09-18 MED ORDER — IPRATROPIUM-ALBUTEROL 0.5-2.5 (3) MG/3ML IN SOLN
RESPIRATORY_TRACT | Status: AC
  Administered 2017-09-18: 14:00:00
  Filled 2017-09-18: qty 9

## 2017-09-18 MED ORDER — BUDESONIDE 0.5 MG/2ML IN SUSP
0.5000 mg | Freq: Two times a day (BID) | RESPIRATORY_TRACT | Status: DC
  Administered 2017-09-18 – 2017-09-21 (×6): 0.5 mg via RESPIRATORY_TRACT
  Filled 2017-09-18 (×7): qty 2

## 2017-09-18 MED ORDER — GUAIFENESIN-DM 100-10 MG/5ML PO SYRP
5.0000 mL | ORAL_SOLUTION | ORAL | Status: DC | PRN
  Administered 2017-09-18 – 2017-09-19 (×3): 5 mL via ORAL
  Filled 2017-09-18 (×4): qty 5

## 2017-09-18 MED ORDER — ACETAMINOPHEN 650 MG RE SUPP: 650.0000 mg | Freq: Four times a day (QID) | RECTAL | Status: DC | PRN

## 2017-09-18 MED ORDER — MAGNESIUM SULFATE 2 GM/50ML IV SOLN
2.0000 g | Freq: Once | INTRAVENOUS | Status: AC
  Administered 2017-09-18: 2 g via INTRAVENOUS
  Filled 2017-09-18: qty 50

## 2017-09-18 NOTE — ED Triage Notes (Signed)
Seen through ED one week ago and diagnosed with bilateral pneumonia.  STarted on Doxycycline and albuterol inhaler.  States symptoms have not improved and began feeling more SOB the past two day.

## 2017-09-18 NOTE — ED Notes (Signed)
Daughter to desk to notify this RN that her mom walked to the bathroom and now she is having a hard time breathing again, vitals signs re-checked and charge nurse aware, room assigned

## 2017-09-18 NOTE — ED Provider Notes (Signed)
[facility] Emergency Department Provider Note  ____________________________________________  Time seen: Approximately 1:12 PM  I have reviewed the triage vital signs and the nursing notes.   HISTORY  Chief Complaint Shortness of Breath   HPI Deborah Carney is a 41 y.o. female with a former history of smoking who presents for evaluation of shortness of breath.Patient was seen here for similar complaint 11 days ago was found to have multifocal pneumonia. She was sent home on albuterol and doxycycline which she finished 2 days ago. She reports that her shortness of breath never improved and over the course of the last 2 days has become significant worse. She is complaining of constant severe shortness of breath and wheezing. Her last fever was 2 days ago. No nausea, vomiting, diarrhea. Patient stopped smoking a year ago. No history of COPD or asthma. She endorses chest tightness but no chest pain. She has a dry cough.  History reviewed. No pertinent past medical history.  There are no active problems to display for this patient.   Past Surgical History:  Procedure Laterality Date  . OOPHORECTOMY Left     Prior to Admission medications   Medication Sig Start Date End Date Taking? Authorizing Provider  albuterol (PROVENTIL HFA;VENTOLIN HFA) 108 (90 Base) MCG/ACT inhaler Inhale 2-4 puffs by mouth every 4 hours as needed for wheezing, cough, and/or shortness of breath 09/07/17   Loleta RoseForbach, Cory, MD  cyclobenzaprine (FLEXERIL) 10 MG tablet Take 1 tablet (10 mg total) by mouth 3 (three) times daily as needed for muscle spasms. 08/08/16   Gilda CreasePollina, Christopher J, MD  doxycycline (VIBRAMYCIN) 100 MG capsule Take 1 capsule (100 mg) by mouth twice daily for 10 days. 09/07/17   Loleta RoseForbach, Cory, MD  HYDROcodone-acetaminophen (NORCO/VICODIN) 5-325 MG tablet Take 1-2 tablets by mouth every 4 (four) hours as needed for moderate pain. 08/08/16   Gilda CreasePollina, Christopher J, MD  naproxen  (NAPROSYN) 375 MG tablet Take 1 tablet (375 mg total) by mouth 2 (two) times daily. 08/08/16   Gilda CreasePollina, Christopher J, MD    Allergies Patient has no known allergies.  No family history on file.  Social History Social History   Tobacco Use  . Smoking status: Former Smoker    Last attempt to quit: 2014    Years since quitting: 4.8  . Smokeless tobacco: Never Used  Substance Use Topics  . Alcohol use: No  . Drug use: No    Review of Systems  Constitutional: Negative for fever. Eyes: Negative for visual changes. ENT: Negative for sore throat. Neck: No neck pain  Cardiovascular: Negative for chest pain. Respiratory: + shortness of breath, cough, wheezing Gastrointestinal: Negative for abdominal pain, vomiting or diarrhea. Genitourinary: Negative for dysuria. Musculoskeletal: Negative for back pain. Skin: Negative for rash. Neurological: Negative for headaches, weakness or numbness. Psych: No SI or HI  ____________________________________________   PHYSICAL EXAM:  VITAL SIGNS: ED Triage Vitals  Enc Vitals Group     BP 09/18/17 1026 (!) 157/96     Pulse Rate 09/18/17 1026 (!) 110     Resp 09/18/17 1026 (!) 24     Temp 09/18/17 1026 (!) 97.4 F (36.3 C)     Temp Source 09/18/17 1026 Oral     SpO2 09/18/17 1026 96 %     Weight 09/18/17 1037 132 lb (59.9 kg)     Height 09/18/17 1037 5\' 2"  (1.575 m)     Head Circumference --      Peak Flow --  Pain Score --      Pain Loc --      Pain Edu? --      Excl. in GC? --     Constitutional: Alert and oriented. Well appearing and in no apparent distress. HEENT:      Head: Normocephalic and atraumatic.         Eyes: Conjunctivae are normal. Sclera is non-icteric.       Mouth/Throat: Mucous membranes are moist.       Neck: Supple with no signs of meningismus. Cardiovascular: Tachycardic with regular rhythm. No murmurs, gallops, or rubs. 2+ symmetrical distal pulses are present in all extremities. No JVD. Respiratory:  Significantly increased work of breathing, tripoding, speaking in 3 word sentences, lungs sound very tight with diffuse expiratory wheezes  Gastrointestinal: Soft, non tender, and non distended with positive bowel sounds. No rebound or guarding. Musculoskeletal: Nontender with normal range of motion in all extremities. No edema, cyanosis, or erythema of extremities. Neurologic: Normal speech and language. Face is symmetric. Moving all extremities. No gross focal neurologic deficits are appreciated. Skin: Skin is warm, dry and intact. No rash noted. Psychiatric: Mood and affect are normal. Speech and behavior are normal.  ____________________________________________   LABS (all labs ordered are listed, but only abnormal results are displayed)  Labs Reviewed  CBC WITH DIFFERENTIAL/PLATELET - Abnormal; Notable for the following components:      Result Value   WBC 11.5 (*)    Neutro Abs 7.1 (*)    Eosinophils Absolute 1.3 (*)    All other components within normal limits  COMPREHENSIVE METABOLIC PANEL - Abnormal; Notable for the following components:   Potassium 3.1 (*)    Glucose, Bld 127 (*)    BUN <5 (*)    All other components within normal limits   ____________________________________________  EKG  ED ECG REPORT I, Nita Sickle, the attending physician, personally viewed and interpreted this ECG.  Sinus tachycardia, rate of 107, normal intervals, normal axis, no ST elevations or depressions. ____________________________________________  RADIOLOGY  CXR:  Persistent patchy infiltrate medial aspect left upper lobe. This finding is felt represent residual pneumonia. Lungs elsewhere clear. Stable cardiac silhouette. ____________________________________________   PROCEDURES  Procedure(s) performed: None Procedures Critical Care performed: yes  CRITICAL CARE Performed by: Nita Sickle  ?  Total critical care time: 40 min  Critical care time was exclusive of  separately billable procedures and treating other patients.  Critical care was necessary to treat or prevent imminent or life-threatening deterioration.  Critical care was time spent personally by me on the following activities: development of treatment plan with patient and/or surrogate as well as nursing, discussions with consultants, evaluation of patient's response to treatment, examination of patient, obtaining history from patient or surrogate, ordering and performing treatments and interventions, ordering and review of laboratory studies, ordering and review of radiographic studies, pulse oximetry and re-evaluation of patient's condition.  ____________________________________________   INITIAL IMPRESSION / ASSESSMENT AND PLAN / ED COURSE  41 y.o. female with a former history of smoking who presents for evaluation of shortness of breath, wheezing, and cough x 11 days. S/p course of doxycycline for multifocal PNA diagnosed on 11/5. Patient with moderate increased work of breathing, tripoding, speaking in 3 word sentences, lungs are very tight with diffuse expiratory wheezes, she is tachycardic but afebrile and has normal sats. 11 days ago when her symptoms started patient underwent a CT angiogram which was negative for PE, therefore do not believe patient requires a repeat CT.  XR showing improved but persistent PNA. Labs show improving leukocytosis   Clinical Course as of Sep 18 1421  Fri Sep 18, 2017  1333 Patient's work of breathing remains elevated after 3 [place] abs, she continues to wheeze and some very tight on exam. Patient was started on 10 mg an hour of albuterol.  [CV]    Clinical Course User Index [CV] Don PerkingVeronese, [place], MD   _________________________ 2:22 PM on 09/18/2017 -----------------------------------------  Patient has been 45 minutes of continuous albuterol with some improvement of her respiratory status continues to wheeze and have increased work of breathing.  Therefore since patient has failed outpatient management for her pneumonia and continues to have significant respiratory distress: Admit her to the hospitalist service for further management.  As part of my medical decision making, I reviewed the following data within the electronic MEDICAL RECORD NUMBER Nursing notes reviewed and incorporated, Labs reviewed , EKG interpreted , Old chart reviewed, Radiograph reviewed , Discussed with admitting physician , Notes from prior ED visits and [place] Controlled Substance Database    Pertinent labs & imaging results that were available during my care of the patient were reviewed by me and considered in my medical decision making (see chart for details).    ____________________________________________   FINAL CLINICAL IMPRESSION(S) / ED DIAGNOSES  Final diagnoses:  Community acquired pneumonia of left upper lobe of lung (HCC)  Respiratory distress      NEW MEDICATIONS STARTED DURING THIS VISIT:  This SmartLink is deprecated. Use AVSMEDLIST instead to display the medication list for a patient.   Note:  This document was prepared using Dragon voice recognition software and may include unintentional dictation errors.    Nita SickleVeronese, [place], MD 09/18/17 [phone]

## 2017-09-18 NOTE — H&P (Signed)
[facility] [place] at [facility]   PATIENT NAME: Deborah StalkerWendy Carney    MR#:  [id number]  DATE OF BIRTH:  06/14/1976  DATE OF ADMISSION:  09/18/2017  PRIMARY CARE PHYSICIAN: Patient, No Pcp Per   REQUESTING/REFERRING PHYSICIAN: Dr Nita Sicklearolina Veronese  CHIEF COMPLAINT:   Chief Complaint  Patient presents with  . Shortness of Breath    HISTORY OF PRESENT ILLNESS:  Deborah StalkerWendy Carney  is a 41 y.o. female was in the ER on 09/07/2017 and seen and treated for multifocal pneumonia seen on CT scan.  She was sent home on doxycycline.  She has 1 or 2 pills left.  The patient states that she cannot breathe.  She has been coughing but mostly dry.  Occasional fever.  Some chills.  Some chest tightness with the difficulty breathing.  In the ER, chest x-ray still showed a left upper lobe pneumonia. Patient requires oxygen and is on 6 L of oxygen.  Patient has an elevated white count and is tachycardic.  Hospitalist services contacted for further evaluation.  PAST MEDICAL HISTORY:   Past Medical History:  Diagnosis Date  . Medical history non-contributory     PAST SURGICAL HISTORY:   Past Surgical History:  Procedure Laterality Date  . OOPHORECTOMY Left   . PARTIAL HYSTERECTOMY      SOCIAL HISTORY:   Social History   Tobacco Use  . Smoking status: Former Smoker    Last attempt to quit: 2014    Years since quitting: 4.8  . Smokeless tobacco: Never Used  Substance Use Topics  . Alcohol use: No    FAMILY HISTORY:   Family History  Problem Relation Age of Onset  . CAD Mother   . Diabetes Mother   . Lung cancer Father   . CVA Father   . COPD Father     DRUG ALLERGIES:  No Known Allergies  REVIEW OF SYSTEMS:  CONSTITUTIONAL: Positive for fever and chills.  Positive for fatigue.  EYES: Occasional blurred vision.  EARS, NOSE, AND THROAT: No tinnitus or ear pain.  Positive for sore throat. RESPIRATORY: Positive for dry cough.  Positive for shortness of breath.   Positive for wheezing.  Last week the patient had one episode of coughing up blood. CARDIOVASCULAR: Some chest pain, no orthopnea, edema.  GASTROINTESTINAL: No nausea, vomiting, diarrhea or abdominal pain. No blood in bowel movements GENITOURINARY: No dysuria, hematuria.  ENDOCRINE: No polyuria, nocturia,  HEMATOLOGY: No anemia, easy bruising or bleeding SKIN: No rash or lesion. MUSCULOSKELETAL: Positive for joint pain NEUROLOGIC: No tingling, numbness, weakness.  PSYCHIATRY: No anxiety or depression.   MEDICATIONS AT HOME:   Prior to Admission medications   Medication Sig Start Date End Date Taking? Authorizing Provider  albuterol (PROVENTIL HFA;VENTOLIN HFA) 108 (90 Base) MCG/ACT inhaler Inhale 2-4 puffs by mouth every 4 hours as needed for wheezing, cough, and/or shortness of breath 09/07/17  Yes Loleta RoseForbach, Cory, MD  doxycycline (VIBRAMYCIN) 100 MG capsule Take 1 capsule (100 mg) by mouth twice daily for 10 days. Patient not taking: Reported on 09/18/2017 09/07/17   Loleta RoseForbach, Cory, MD      VITAL SIGNS:  Blood pressure (!) 161/94, pulse (!) 114, temperature (!) 97.4 F (36.3 C), temperature source Oral, resp. rate (!) 32, height 5\' 2"  (1.575 m), weight 59.9 kg (132 lb), SpO2 92 %.  PHYSICAL EXAMINATION:  GENERAL:  41 y.o.-year-old patient sitting in the bed with acute distress.  EYES: Pupils equal, round, reactive to light and accommodation. No scleral  icterus. Extraocular muscles intact.  HEENT: Head atraumatic, normocephalic. Oropharynx and nasopharynx clear.  NECK:  Supple, no jugular venous distention. No thyroid enlargement, no tenderness.  LUNGS: Decreased breath sounds bilaterally, poor air entry bilaterally.  Positive wheezing throughout entire lung field.  No rales,rhonchi or crepitation.  Positive use of accessory muscles of respiration.  CARDIOVASCULAR: S1, S2 tachycardic. No murmurs, rubs, or gallops.  ABDOMEN: Soft, nontender, nondistended. Bowel sounds present. No  organomegaly or mass.  EXTREMITIES: No pedal edema, cyanosis, or clubbing.  NEUROLOGIC: Cranial nerves II through XII are intact. Muscle strength 5/5 in all extremities. Sensation intact. Gait not checked.  PSYCHIATRIC: The patient is alert and oriented x 3.  SKIN: No rash, lesion, or ulcer.   LABORATORY PANEL:   CBC Recent Labs  Lab 09/18/17 1036  WBC 11.5*  HGB 15.3  HCT 45.3  PLT 410   ------------------------------------------------------------------------------------------------------------------  Chemistries  Recent Labs  Lab 09/18/17 1036  NA 138  K 3.1*  CL 105  CO2 24  GLUCOSE 127*  BUN <5*  CREATININE 0.47  CALCIUM 8.9  AST 28  ALT 26  ALKPHOS 98  BILITOT 0.9   ------------------------------------------------------------------------------------------------------------------   RADIOLOGY:  Dg Chest 2 View  Result Date: 09/18/2017 CLINICAL DATA:  Shortness of breath with cough EXAM: CHEST  2 VIEW COMPARISON:  Chest CT and chest radiograph September 07, 2017 FINDINGS: There is mild patchy infiltrate in the medial aspect of the left upper lobe consistent with residual pneumonia. Lungs elsewhere clear. Heart size and pulmonary vascularity are normal. No adenopathy. No bone lesions. IMPRESSION: Persistent patchy infiltrate medial aspect left upper lobe. This finding is felt represent residual pneumonia. Lungs elsewhere clear. Stable cardiac silhouette. Electronically Signed   By: Bretta BangWilliam  Woodruff III M.D.   On: 09/18/2017 11:39    EKG:   Sinus tachycardia 107 bpm, left atrial enlargement, nonspecific ST-T wave changes  IMPRESSION AND PLAN:   1.  Acute hypoxic respiratory failure requiring oxygen.  Patient on 6 L of oxygen currently. 2.  Asthmatic bronchitis versus COPD exacerbation in a former smoker.  Solu-Medrol 125 mg given in the ER.  I will continue 40 mg IV daily with another dose this evening.  Continue DuoNeb nebulizer solution and add budesonide  nebulizers. 3.  Clinical sepsis with multifocal pneumonia on previous CAT scan.  This chest x-ray showing left upper lobe pneumonia.  Patient is tachycardic and has leukocytosis and tachypneic.  Unclear if patient will need another course of antibiotics or not but I did switch the patient over to Rocephin and Zithromax.  Send off a pro-calcitonin.  IV fluid hydration. 4.  Hypokalemia.  Oral potassium supplementation  All the records are reviewed and case discussed with ED provider. Management plans discussed with the patient, family and they are in agreement.  CODE STATUS: Full code  TOTAL TIME TAKING CARE OF THIS PATIENT: 50 minutes.    Alford Highlandichard Mickael Mcnutt M.D on 09/18/2017 at 2:54 PM  Between 7am to 6pm - Pager - [phone]  After 6pm call admission pager [phone]  [facility] Office  [phone]  CC: Primary care physician; Patient, No Pcp Per

## 2017-09-18 NOTE — ED Notes (Signed)
Pt got up to bathroom, she is still having labored breathing and tripoding. Hospitalist is in room to assess patient.

## 2017-09-19 LAB — MAGNESIUM: Magnesium: 1.9 mg/dL (ref 1.7–2.4)

## 2017-09-19 LAB — CBC
HCT: 41.6 % (ref 35.0–47.0)
HEMOGLOBIN: 13.7 g/dL (ref 12.0–16.0)
MCH: 31.9 pg (ref 26.0–34.0)
MCHC: 33 g/dL (ref 32.0–36.0)
MCV: 96.6 fL (ref 80.0–100.0)
PLATELETS: 408 10*3/uL (ref 150–440)
RBC: 4.3 MIL/uL (ref 3.80–5.20)
RDW: 14.7 % — AB (ref 11.5–14.5)
WBC: 28.3 10*3/uL — ABNORMAL HIGH (ref 3.6–11.0)

## 2017-09-19 LAB — BASIC METABOLIC PANEL
ANION GAP: 10 (ref 5–15)
BUN: <5 mg/dL — ABNORMAL LOW (ref 6–20)
CALCIUM: 8.9 mg/dL (ref 8.9–10.3)
CO2: 21 mmol/L — AB (ref 22–32)
CREATININE: 0.67 mg/dL (ref 0.44–1.00)
Chloride: 109 mmol/L (ref 101–111)
GFR calc non Af Amer: >60 mL/min (ref >60)
Glucose, Bld: 118 mg/dL — ABNORMAL HIGH (ref 65–99)
Potassium: 3.3 mmol/L — ABNORMAL LOW (ref 3.5–5.1)
SODIUM: 140 mmol/L (ref 135–145)

## 2017-09-19 LAB — PROCALCITONIN: Procalcitonin: <0.1 ng/mL

## 2017-09-19 MED ORDER — BUTALBITAL-APAP-CAFFEINE 50-325-40 MG PO TABS
1.0000 | ORAL_TABLET | Freq: Four times a day (QID) | ORAL | Status: DC | PRN
  Administered 2017-09-19 – 2017-09-21 (×7): 1 via ORAL
  Filled 2017-09-19 (×8): qty 1

## 2017-09-19 MED ORDER — BUTALBITAL-APAP-CAFFEINE 50-325-40 MG PO TABS: 1.0000 | ORAL_TABLET | ORAL | Status: DC | PRN

## 2017-09-19 MED ORDER — DEXTROMETHORPHAN-GUAIFENESIN 10-100 MG/5ML PO LIQD
10.0000 mL | ORAL | Status: DC | PRN
  Administered 2017-09-19 – 2017-09-21 (×7): 10 mL via ORAL
  Filled 2017-09-19 (×12): qty 10

## 2017-09-19 MED ORDER — POTASSIUM CHLORIDE CRYS ER 20 MEQ PO TBCR
40.0000 meq | EXTENDED_RELEASE_TABLET | ORAL | Status: AC
  Administered 2017-09-19 (×2): 40 meq via ORAL
  Filled 2017-09-19 (×2): qty 2

## 2017-09-19 NOTE — Plan of Care (Signed)
  Progressing Education: Knowledge of General Education information will improve 09/19/2017 1436 - Progressing by Kathreen CosierMalcolm, Shuaib Corsino A, RN Health Behavior/Discharge Planning: Ability to manage health-related needs will improve 09/19/2017 1436 - Progressing by Kathreen CosierMalcolm, Nastashia Gallo A, RN Clinical Measurements: Ability to maintain clinical measurements within normal limits will improve 09/19/2017 1436 - Progressing by Kathreen CosierMalcolm, Kyair Ditommaso A, RN Will remain free from infection 09/19/2017 1436 - Progressing by Kathreen CosierMalcolm, Cythnia Osmun A, RN Diagnostic test results will improve 09/19/2017 1436 - Progressing by Kathreen CosierMalcolm, Raine Elsass A, RN Respiratory complications will improve 09/19/2017 1436 - Progressing by Kathreen CosierMalcolm, Kindred Heying A, RN Cardiovascular complication will be avoided 09/19/2017 1436 - Progressing by Kathreen CosierMalcolm, Demiana Crumbley A, RN Activity: Risk for activity intolerance will decrease 09/19/2017 1436 - Progressing by Kathreen CosierMalcolm, Lothar Prehn A, RN Nutrition: Adequate nutrition will be maintained 09/19/2017 1436 - Progressing by Kathreen CosierMalcolm, Luca Dyar A, RN Coping: Level of anxiety will decrease 09/19/2017 1436 - Progressing by Kathreen CosierMalcolm, Maeson Lourenco A, RN Elimination: Will not experience complications related to bowel motility 09/19/2017 1436 - Progressing by Kathreen CosierMalcolm, Viveka Wilmeth A, RN Will not experience complications related to urinary retention 09/19/2017 1436 - Progressing by Kathreen CosierMalcolm, Chalyn Amescua A, RN Pain Managment: General experience of comfort will improve 09/19/2017 1436 - Progressing by Kathreen CosierMalcolm, Camara Renstrom A, RN Safety: Ability to remain free from injury will improve 09/19/2017 1436 - Progressing by Kathreen CosierMalcolm, Rashawnda Gaba A, RN Skin Integrity: Risk for impaired skin integrity will decrease 09/19/2017 1436 - Progressing by Kathreen CosierMalcolm, Marguerette Sheller A, RN

## 2017-09-19 NOTE — Progress Notes (Signed)
Pharmacy Electrolyte Monitoring Consult:  Pharmacy consulted to assist in monitoring and replacing electrolytes in this 41 y.o. female admitted on 09/18/2017 with Shortness of Breath   Labs: Sodium (mmol/L)  Date Value  09/19/2017 140   Potassium (mmol/L)  Date Value  09/19/2017 3.3 (L)   Calcium (mg/dL)  Date Value  91/47/829511/17/2018 8.9   Albumin (g/dL)  Date Value  62/13/086511/16/2018 4.1    Plan: Potassium replaced orally by MD. Will add-on magnesium level to AM labs and supplement as necessary.   Luisa HartScott Dafney Farler, PharmD Clinical Pharmacist

## 2017-09-19 NOTE — Progress Notes (Signed)
[facility] - [place] at [facility]   PATIENT NAME: Deborah StalkerWendy Olmsted    MR#:  [id number]  DATE OF BIRTH:  07-Sep-1976  SUBJECTIVE:  CHIEF COMPLAINT:  Pt's sob is better , reporting headache and cough  REVIEW OF SYSTEMS:  CONSTITUTIONAL: No fever, fatigue or weakness.  EYES: No blurred or double vision. Reports headache - frontal EARS, NOSE, AND THROAT: No tinnitus or ear pain.  RESPIRATORY:  Cough and shortness of breath are better, no wheezing or hemoptysis.  CARDIOVASCULAR: No chest pain, orthopnea, edema.  GASTROINTESTINAL: No nausea, vomiting, diarrhea or abdominal pain.  GENITOURINARY: No dysuria, hematuria.  ENDOCRINE: No polyuria, nocturia,  HEMATOLOGY: No anemia, easy bruising or bleeding SKIN: No rash or lesion. MUSCULOSKELETAL: No joint pain or arthritis.   NEUROLOGIC: No tingling, numbness, weakness.  PSYCHIATRY: No anxiety or depression.   DRUG ALLERGIES:  No Known Allergies  VITALS:  Blood pressure 122/67, pulse 84, temperature 98.3 F (36.8 C), temperature source Oral, resp. rate 20, height 5\' 2"  (1.575 m), weight 59.9 kg (132 lb), SpO2 99 %.  PHYSICAL EXAMINATION:  GENERAL:  41 y.o.-year-old patient lying in the bed with no acute distress.  EYES: Pupils equal, round, reactive to light and accommodation. No scleral icterus. Extraocular muscles intact.  HEENT: Head atraumatic, normocephalic. Oropharynx and nasopharynx clear.  NECK:  Supple, no jugular venous distention. No thyroid enlargement, no tenderness.  LUNGS: mod  breath sounds bilaterally, no wheezing, rales,rhonchi ; left lung with crepitation. No use of accessory muscles of respiration.  CARDIOVASCULAR: S1, S2 normal. No murmurs, rubs, or gallops.  ABDOMEN: Soft, nontender, nondistended. Bowel sounds present. No organomegaly or mass.  EXTREMITIES: No pedal edema, cyanosis, or clubbing.  NEUROLOGIC: Cranial nerves II through XII are intact. Muscle strength 5/5 in all extremities.  Sensation intact. Gait not checked.  PSYCHIATRIC: The patient is alert and oriented x 3.  SKIN: No obvious rash, lesion, or ulcer.    LABORATORY PANEL:   CBC Recent Labs  Lab 09/19/17 0458  WBC 28.3*  HGB 13.7  HCT 41.6  PLT 408   ------------------------------------------------------------------------------------------------------------------  Chemistries  Recent Labs  Lab 09/18/17 1036 09/19/17 0458  NA 138 140  K 3.1* 3.3*  CL 105 109  CO2 24 21*  GLUCOSE 127* 118*  BUN <5* <5*  CREATININE 0.47 0.67  CALCIUM 8.9 8.9  MG  --  1.9  AST 28  --   ALT 26  --   ALKPHOS 98  --   BILITOT 0.9  --    ------------------------------------------------------------------------------------------------------------------  Cardiac Enzymes No results for input(s): TROPONINI in the last 168 hours. ------------------------------------------------------------------------------------------------------------------  RADIOLOGY:  Dg Chest 2 View  Result Date: 09/18/2017 CLINICAL DATA:  Shortness of breath with cough EXAM: CHEST  2 VIEW COMPARISON:  Chest CT and chest radiograph September 07, 2017 FINDINGS: There is mild patchy infiltrate in the medial aspect of the left upper lobe consistent with residual pneumonia. Lungs elsewhere clear. Heart size and pulmonary vascularity are normal. No adenopathy. No bone lesions. IMPRESSION: Persistent patchy infiltrate medial aspect left upper lobe. This finding is felt represent residual pneumonia. Lungs elsewhere clear. Stable cardiac silhouette. Electronically Signed   By: Bretta BangWilliam  Woodruff III M.D.   On: 09/18/2017 11:39    EKG:   Orders placed or performed during the hospital encounter of 09/18/17  . ED EKG  . ED EKG    ASSESSMENT AND PLAN:    1.  Acute hypoxic respiratory failure requiring oxygen.  Patient on 4  L of oxygen currently. Wean off a tolerated  2.  Asthmatic bronchitis versus COPD exacerbation in a former smoker.    Solu-Medrol  40 mg IV daily  .  Continue DuoNeb nebulizer solution and add budesonide nebulizers.  3.  Clinical sepsis with multifocal pneumonia on previous CAT scan.  chest x-ray showing left upper lobe pneumonia.   Patient is tachycardic and has leukocytosis and tachypneic at admission  Rocephin and Zithromax.    pro-calcitonin <0.10   IV fluid hydration.  4.  Hypokalemia.  Oral potassium supplementation, check am labs  5. Headache -tylenol prn       All the records are reviewed and case discussed with Care Management/Social Workerr. Management plans discussed with the patient, family and they are in agreement.  CODE STATUS: fc   TOTAL TIME TAKING CARE OF THIS PATIENT: 36 minutes.   POSSIBLE D/C IN 1-2  DAYS, DEPENDING ON CLINICAL CONDITION.  Note: This dictation was prepared with Dragon dictation along with smaller phrase technology. Any transcriptional errors that result from this process are unintentional.   Ramonita LabGouru, Desiderio Dolata M.D on 09/19/2017 at 11:39 AM  Between 7am to 6pm - Pager - [phone] After 6pm go to [email or web address] - password [email or web address]  [facility] [facility]  Office  [phone]  CC: Primary care physician; Patient, No Pcp Per

## 2017-09-19 NOTE — Plan of Care (Signed)
Tachypneic during shift, received scheduled Duonebs x2, WDL at AM VS check.  Reported HA 8/10, coughing spell, received PRN PO Tylenol 650mg , PRN PO Robitussin DM 100-10mg .  No other complaints overnight.  Bed in low position, call bell within reach.  WCTM.

## 2017-09-20 LAB — PROCALCITONIN

## 2017-09-20 LAB — HIV ANTIBODY (ROUTINE TESTING W REFLEX): HIV SCREEN 4TH GENERATION: NONREACTIVE

## 2017-09-20 LAB — BASIC METABOLIC PANEL
ANION GAP: 6 (ref 5–15)
BUN: 9 mg/dL (ref 6–20)
CALCIUM: 8.3 mg/dL — AB (ref 8.9–10.3)
CO2: 25 mmol/L (ref 22–32)
Chloride: 110 mmol/L (ref 101–111)
Creatinine, Ser: 0.52 mg/dL (ref 0.44–1.00)
GLUCOSE: 124 mg/dL — AB (ref 65–99)
Potassium: 3.2 mmol/L — ABNORMAL LOW (ref 3.5–5.1)
SODIUM: 141 mmol/L (ref 135–145)

## 2017-09-20 LAB — MAGNESIUM: Magnesium: 1.7 mg/dL (ref 1.7–2.4)

## 2017-09-20 MED ORDER — ENSURE ENLIVE PO LIQD
237.0000 mL | ORAL | Status: DC
  Administered 2017-09-20: 237 mL via ORAL

## 2017-09-20 MED ORDER — MAGNESIUM SULFATE 2 GM/50ML IV SOLN
2.0000 g | Freq: Once | INTRAVENOUS | Status: AC
  Administered 2017-09-20: 13:00:00 2 g via INTRAVENOUS
  Filled 2017-09-20: qty 50

## 2017-09-20 MED ORDER — POTASSIUM CHLORIDE CRYS ER 20 MEQ PO TBCR
40.0000 meq | EXTENDED_RELEASE_TABLET | ORAL | Status: AC
  Administered 2017-09-20 (×2): 40 meq via ORAL
  Filled 2017-09-20 (×2): qty 2

## 2017-09-20 NOTE — Care Management Note (Addendum)
Case Management Note  Patient Details  Name: Deborah Carney MRN: [id number] Date of Birth: 1976/08/22  Subjective/Objective:        Deborah Carney was admitted with respiratory issues. She just completed 2 weeks of doxycycline at home for pneumonia, but became short of breath and required 6L N/C in the ED. Marland Kitchen.She currently has no PCP, and was provided with the phone number of the [facility] to call and inquire about which local physicians are accepting new patients. Pharmacy=[facility] [address]. She resides with her husband and 19yo daughter.  She has no home assistive equipment, no home health services, and no home oxygen. She drives herself to appointments. [facility]. Case management will follow for discharge planning.           Action/Plan:   Expected Discharge Date:  09/21/17               Expected Discharge Plan:     In-House Referral:     Discharge planning Services     Post Acute Care Choice:    Choice offered to:     DME Arranged:    DME Agency:     HH Arranged:    HH Agency:     Status of Service:     If discussed at [employer] Meetings, dates discussed:    Additional Comments:  Kasondra Junod A, RN 09/20/2017, 4:26 PM

## 2017-09-20 NOTE — Progress Notes (Signed)
Initial Nutrition Assessment  DOCUMENTATION CODES:   Not applicable  INTERVENTION:  Provide Ensure Enlive po once daily, each supplement provides 350 kcal and 20 grams of protein.  NUTRITION DIAGNOSIS:   Inadequate oral intake related to decreased appetite as evidenced by per patient/family report.  GOAL:   Patient will meet greater than or equal to 90% of their needs  MONITOR:   PO intake, Supplement acceptance, Labs, Weight trends, I & O's  REASON FOR ASSESSMENT:   Malnutrition Screening Tool    ASSESSMENT:   42 year old female with no significant PMHx recently being treated outpatient for multifocal PNA who is now admitted with acute hypoxic respiratory failure requiring oxygen, asthmatic bronchitis versus COPD exacerbation in former smoker.   Met with patient at bedside. She reports she has had a decreased appetite for a few weeks due to difficulty breathing. She does not typically eat breakfast. She eats lunch and dinner daily. Unable to provide good history of typical intake, but does report eating a good source of protein at lunch and dinner.   Patient is concerned that she has lost 4 lbs in two weeks. She reports she typically weighs between 130-140 lbs, but someone told her on admission she has lost 4 lbs. Unreliable weight history in chart as each weight is entered as exactly 132 lbs, so likely not a true measured weight. RD obtained bed scale weight of 158.8 lbs today. Patient is requesting Ensure because she is concerned about weight loss.  Meal Completion: 90-100%  Medications reviewed and include: azithromycin, methylprednisolone 40 mg daily IV, potassium chloride 40 mEq Q4hrs, ceftriaxone.  Labs reviewed: Potassium 3.2.  Patient does not meet criteria for malnutrition.  NUTRITION - FOCUSED PHYSICAL EXAM:    Most Recent Value  Orbital Region  No depletion  Upper Arm Region  No depletion  Thoracic and Lumbar Region  No depletion  Buccal Region  No  depletion  Temple Region  No depletion  Clavicle Bone Region  No depletion  Clavicle and Acromion Bone Region  No depletion  Scapular Bone Region  No depletion  Dorsal Hand  No depletion  Patellar Region  No depletion  Anterior Thigh Region  No depletion  Posterior Calf Region  No depletion  Edema (RD Assessment)  None  Hair  Reviewed  Eyes  Reviewed  Mouth  Reviewed  Skin  Reviewed  Nails  Reviewed     Diet Order:  Diet regular Room service appropriate? Yes; Fluid consistency: Thin  EDUCATION NEEDS:   No education needs have been identified at this time  Skin:  Skin Assessment: Reviewed RN Assessment(no issues)  Last BM:  09/18/2017  Height:   Ht Readings from Last 1 Encounters:  09/18/17 _0  (1.575 m)    Weight:   Wt Readings from Last 1 Encounters:  09/20/17 158 lb 12.8 oz (72 kg)    Ideal Body Weight:  50 kg  BMI:  Body mass index is 29.04 kg/m.  Estimated Nutritional Needs:   Kcal:  1600-1870 (MSJ x 1.2-1.4)  Protein:  75-85 grams (1-1.2 grams/kg)  Fluid:  1.6-1.9 L/day (1 mL/kcal)  Willey Blade, MS, RD, LDN Office: [phone] Pager: [phone] After Hours/Weekend Pager: [phone]

## 2017-09-20 NOTE — Plan of Care (Signed)
  Progressing Education: Knowledge of General Education information will improve 09/20/2017 1428 - Progressing by Garwin Brothershomas, Emina Ribaudo Lynn, RN Health Behavior/Discharge Planning: Ability to manage health-related needs will improve 09/20/2017 1428 - Progressing by Garwin Brothershomas, Anndee Connett Lynn, RN Clinical Measurements: Ability to maintain clinical measurements within normal limits will improve 09/20/2017 1428 - Progressing by Garwin Brothershomas, Morelia Cassells Lynn, RN Will remain free from infection 09/20/2017 1428 - Progressing by Garwin Brothershomas, Anne Boltz Lynn, RN Diagnostic test results will improve 09/20/2017 1428 - Progressing by Garwin Brothershomas, Angel Weedon Lynn, RN Respiratory complications will improve 09/20/2017 1428 - Progressing by Garwin Brothershomas, Nyemah Watton Lynn, RN Note Able to wean pt to Room Air with O2 sats > 92% this shift Cardiovascular complication will be avoided 09/20/2017 1428 - Progressing by Garwin Brothershomas, Elica Almas Lynn, RN Activity: Risk for activity intolerance will decrease 09/20/2017 1428 - Progressing by Garwin Brothershomas, Rozina Pointer Lynn, RN Nutrition: Adequate nutrition will be maintained 09/20/2017 1428 - Progressing by Garwin Brothershomas, Vikki Gains Lynn, RN Coping: Level of anxiety will decrease 09/20/2017 1428 - Progressing by Garwin Brothershomas, Kianah Harries Lynn, RN Elimination: Will not experience complications related to bowel motility 09/20/2017 1428 - Progressing by Garwin Brothershomas, Shamica Moree Lynn, RN Will not experience complications related to urinary retention 09/20/2017 1428 - Progressing by Garwin Brothershomas, Daylen Hack Lynn, RN Pain Managment: General experience of comfort will improve 09/20/2017 1428 - Progressing by Garwin Brothershomas, Cuauhtemoc Huegel Lynn, RN Safety: Ability to remain free from injury will improve 09/20/2017 1428 - Progressing by Garwin Brothershomas, Sawsan Riggio Lynn, RN Note Pt remains on Moderate Fall Risks; oob to br without assistance Skin Integrity: Risk for impaired skin integrity will decrease 09/20/2017 1428 - Progressing by Garwin Brothershomas, Pachia Strum Lynn, RN

## 2017-09-20 NOTE — Progress Notes (Signed)
[facility] - [place] at [facility]   PATIENT NAME: Deborah StalkerWendy Hrivnak    MR#:  [id number]  DATE OF BIRTH:  11-30-1975  SUBJECTIVE:  CHIEF COMPLAINT:  Pt's sob is better , headache resolved, feeling better Currently on 2 L of oxygen ,does not live on home oxygen  REVIEW OF SYSTEMS:  CONSTITUTIONAL: No fever, fatigue or weakness.  EYES: No blurred or double vision. Reports headache - frontal EARS, NOSE, AND THROAT: No tinnitus or ear pain.  RESPIRATORY:  Cough and shortness of breath are better, no wheezing or hemoptysis.  CARDIOVASCULAR: No chest pain, orthopnea, edema.  GASTROINTESTINAL: No nausea, vomiting, diarrhea or abdominal pain.  GENITOURINARY: No dysuria, hematuria.  ENDOCRINE: No polyuria, nocturia,  HEMATOLOGY: No anemia, easy bruising or bleeding SKIN: No rash or lesion. MUSCULOSKELETAL: No joint pain or arthritis.   NEUROLOGIC: No tingling, numbness, weakness.  PSYCHIATRY: No anxiety or depression.   DRUG ALLERGIES:  No Known Allergies  VITALS:  Blood pressure 116/70, pulse 84, temperature 98.3 F (36.8 C), temperature source Oral, resp. rate 16, height 5\' 2"  (1.575 m), weight 59.9 kg (132 lb), SpO2 99 %.  PHYSICAL EXAMINATION:  GENERAL:  41 y.o.-year-old patient lying in the bed with no acute distress.  EYES: Pupils equal, round, reactive to light and accommodation. No scleral icterus. Extraocular muscles intact.  HEENT: Head atraumatic, normocephalic. Oropharynx and nasopharynx clear.  NECK:  Supple, no jugular venous distention. No thyroid enlargement, no tenderness.  LUNGS: mod  breath sounds bilaterally, no wheezing, rales,rhonchi ; left lung with crepitation. No use of accessory muscles of respiration.  CARDIOVASCULAR: S1, S2 normal. No murmurs, rubs, or gallops.  ABDOMEN: Soft, nontender, nondistended. Bowel sounds present. No organomegaly or mass.  EXTREMITIES: No pedal edema, cyanosis, or clubbing.  NEUROLOGIC: Cranial nerves II  through XII are intact. Muscle strength 5/5 in all extremities. Sensation intact. Gait not checked.  PSYCHIATRIC: The patient is alert and oriented x 3.  SKIN: No obvious rash, lesion, or ulcer.    LABORATORY PANEL:   CBC Recent Labs  Lab 09/19/17 0458  WBC 28.3*  HGB 13.7  HCT 41.6  PLT 408   ------------------------------------------------------------------------------------------------------------------  Chemistries  Recent Labs  Lab 09/18/17 1036  09/20/17 0453  NA 138   < > 141  K 3.1*   < > 3.2*  CL 105   < > 110  CO2 24   < > 25  GLUCOSE 127*   < > 124*  BUN <5*   < > 9  CREATININE 0.47   < > 0.52  CALCIUM 8.9   < > 8.3*  MG  --    < > 1.7  AST 28  --   --   ALT 26  --   --   ALKPHOS 98  --   --   BILITOT 0.9  --   --    < > = values in this interval not displayed.   ------------------------------------------------------------------------------------------------------------------  Cardiac Enzymes No results for input(s): TROPONINI in the last 168 hours. ------------------------------------------------------------------------------------------------------------------  RADIOLOGY:  No results found.  EKG:   Orders placed or performed during the hospital encounter of 09/18/17  . ED EKG  . ED EKG    ASSESSMENT AND PLAN:    1.  Acute hypoxic respiratory failure requiring oxygen.  Patient on 2 L of oxygen currently. Wean off a tolerated  2.  Asthmatic bronchitis versus COPD exacerbation in a former smoker.  Clinically improving  Solu-Medrol  40 mg IV  daily ,taper .  Continue DuoNeb nebulizer solution and add budesonide nebulizers.  3.  Clinical sepsis with multifocal pneumonia on previous CAT scan.  chest x-ray showing left upper lobe pneumonia.   Patient is tachycardic and has leukocytosis and tachypneic at admission  Rocephin and Zithromax.    pro-calcitonin <0.10   IV fluid hydration.  4.  Hypokalemia.  Oral potassium supplementation, check am  labs  5. Headache -tylenol prn       All the records are reviewed and case discussed with Care Management/Social Workerr. Management plans discussed with the patient, family and they are in agreement.  CODE STATUS: fc   TOTAL TIME TAKING CARE OF THIS PATIENT: 36 minutes.   POSSIBLE D/C IN 1  DAYS, DEPENDING ON CLINICAL CONDITION.  Note: This dictation was prepared with Dragon dictation along with smaller phrase technology. Any transcriptional errors that result from this process are unintentional.   Ramonita LabGouru, Larrissa Stivers M.D on 09/20/2017 at 1:42 PM  Between 7am to 6pm - Pager - [phone] After 6pm go to [email or web address] - password [email or web address]  [facility]  Office  [phone]  CC: Primary care physician; Patient, No Pcp Per

## 2017-09-20 NOTE — Progress Notes (Signed)
Pharmacy Electrolyte Monitoring Consult:  Pharmacy consulted to assist in monitoring and replacing electrolytes in this 41 y.o. female admitted on 09/18/2017 with Shortness of Breath   Labs: Sodium (mmol/L)  Date Value  09/20/2017 141   Potassium (mmol/L)  Date Value  09/20/2017 3.2 (L)   Magnesium (mg/dL)  Date Value  50/09/381811/18/2018 1.7   Calcium (mg/dL)  Date Value  29/93/716911/18/2018 8.3 (L)   Albumin (g/dL)  Date Value  67/89/381011/16/2018 4.1    Plan: K 3.2, Mag 1.7. Will order Magnesium sulfate 2 gram IV x 1  And KDUR PO 40 meq q4h x 2 doses   Bari MantisKristin Riddhi Grether PharmD Clinical Pharmacist 09/20/2017

## 2017-09-21 LAB — BASIC METABOLIC PANEL
ANION GAP: 9 (ref 5–15)
BUN: 8 mg/dL (ref 6–20)
CO2: 23 mmol/L (ref 22–32)
Calcium: 8.6 mg/dL — ABNORMAL LOW (ref 8.9–10.3)
Chloride: 108 mmol/L (ref 101–111)
Creatinine, Ser: 0.45 mg/dL (ref 0.44–1.00)
GFR calc non Af Amer: >60 mL/min (ref >60)
GLUCOSE: 94 mg/dL (ref 65–99)
POTASSIUM: 3.4 mmol/L — AB (ref 3.5–5.1)
Sodium: 140 mmol/L (ref 135–145)

## 2017-09-21 LAB — CBC
HEMATOCRIT: 38.4 % (ref 35.0–47.0)
HEMOGLOBIN: 13.1 g/dL (ref 12.0–16.0)
MCH: 33.1 pg (ref 26.0–34.0)
MCHC: 34.3 g/dL (ref 32.0–36.0)
MCV: 96.8 fL (ref 80.0–100.0)
Platelets: 329 10*3/uL (ref 150–440)
RBC: 3.97 MIL/uL (ref 3.80–5.20)
RDW: 14.6 % — ABNORMAL HIGH (ref 11.5–14.5)
WBC: 15.6 10*3/uL — ABNORMAL HIGH (ref 3.6–11.0)

## 2017-09-21 LAB — MAGNESIUM: MAGNESIUM: 1.9 mg/dL (ref 1.7–2.4)

## 2017-09-21 MED ORDER — IPRATROPIUM-ALBUTEROL 0.5-2.5 (3) MG/3ML IN SOLN: 3.0000 mL | Freq: Four times a day (QID) | RESPIRATORY_TRACT | Status: DC | PRN

## 2017-09-21 MED ORDER — AMOXICILLIN-POT CLAVULANATE 875-125 MG PO TABS
1.0000 | ORAL_TABLET | Freq: Two times a day (BID) | ORAL | Status: DC
  Administered 2017-09-21: 14:00:00 1 via ORAL
  Filled 2017-09-21: qty 1

## 2017-09-21 MED ORDER — ALBUTEROL SULFATE HFA 108 (90 BASE) MCG/ACT IN AERS: INHALATION_SPRAY | RESPIRATORY_TRACT | 1 refills | Status: AC

## 2017-09-21 MED ORDER — IPRATROPIUM-ALBUTEROL 0.5-2.5 (3) MG/3ML IN SOLN
3.0000 mL | Freq: Three times a day (TID) | RESPIRATORY_TRACT | Status: DC
  Administered 2017-09-21 (×2): 3 mL via RESPIRATORY_TRACT
  Filled 2017-09-21 (×2): qty 3

## 2017-09-21 MED ORDER — DEXTROMETHORPHAN-GUAIFENESIN 10-100 MG/5ML PO LIQD: 10.0000 mL | ORAL | Status: DC | PRN

## 2017-09-21 MED ORDER — ENSURE ENLIVE PO LIQD: 237.0000 mL | ORAL | 12 refills | Status: DC

## 2017-09-21 MED ORDER — POTASSIUM CHLORIDE CRYS ER 20 MEQ PO TBCR
40.0000 meq | EXTENDED_RELEASE_TABLET | Freq: Once | ORAL | Status: AC
  Administered 2017-09-21: 09:00:00 40 meq via ORAL
  Filled 2017-09-21: qty 2

## 2017-09-21 MED ORDER — POTASSIUM CHLORIDE CRYS ER 20 MEQ PO TBCR
40.0000 meq | EXTENDED_RELEASE_TABLET | ORAL | Status: DC
  Administered 2017-09-21: 12:00:00 40 meq via ORAL
  Filled 2017-09-21: qty 2

## 2017-09-21 MED ORDER — AMOXICILLIN-POT CLAVULANATE 875-125 MG PO TABS: 1.0000 | ORAL_TABLET | Freq: Two times a day (BID) | ORAL | 0 refills | Status: DC

## 2017-09-21 MED ORDER — ENSURE ENLIVE PO LIQD: 237.0000 mL | ORAL | 0 refills | Status: DC

## 2017-09-21 NOTE — Discharge Instructions (Signed)
Follow-[facility] physician or [facility] in 1 week

## 2017-09-21 NOTE — Progress Notes (Signed)
Patient discharged home per MD order. Prescriptions given to patient. All discharge instructions given and all questions answered. 

## 2017-09-21 NOTE — Progress Notes (Signed)
Pharmacy Electrolyte Monitoring Consult:  Pharmacy consulted to assist in monitoring and replacing electrolytes in this 41 y.o. female admitted on 09/18/2017 with Shortness of Breath   Labs: Sodium (mmol/L)  Date Value  09/21/2017 140   Potassium (mmol/L)  Date Value  09/21/2017 3.4 (L)   Magnesium (mg/dL)  Date Value  09/81/191411/19/2018 1.9   Calcium (mg/dL)  Date Value  78/29/562111/19/2018 8.6 (L)   Albumin (g/dL)  Date Value  30/86/578411/16/2018 4.1    Plan: K 3.4,  Will order KDUR PO 40 meq q4h x 2 doses   Demetrius Charityeldrin D. Khaleah Duer, PharmD  Clinical Pharmacist 09/21/2017

## 2017-09-21 NOTE — Discharge Summary (Addendum)
[facility] - [place] at [facility]   PATIENT NAME: Deborah Carney    MR#:  [id number]  DATE OF BIRTH:  1976-08-18  DATE OF ADMISSION:  09/18/2017 ADMITTING PHYSICIAN: Alford Highland, MD  DATE OF DISCHARGE:  09/21/17  PRIMARY CARE PHYSICIAN: Patient, No Pcp Per    ADMISSION DIAGNOSIS:  Respiratory distress [R06.03] Community acquired pneumonia of left upper lobe of lung (HCC) [J18.1]  DISCHARGE DIAGNOSIS:  Active Problems:   Acute respiratory failure with hypoxia (HCC) Community-acquired pneumonia  SECONDARY DIAGNOSIS:   Past Medical History:  Diagnosis Date  . Medical history non-contributory     HOSPITAL COURSE:  HPI  Deborah Carney  is a 41 y.o. female was in the ER on 09/07/2017 and seen and treated for multifocal pneumonia seen on CT scan.  She was sent home on doxycycline.  She has 1 or 2 pills left.  The patient states that she cannot breathe.  She has been coughing but mostly dry.  Occasional fever.  Some chills.  Some chest tightness with the difficulty breathing.  In the ER, chest x-ray still showed a left upper lobe pneumonia. Patient requires oxygen and is on 6 L of oxygen.  Patient has an elevated white count and is tachycardic.  Hospitalist services contacted for further evaluation.   1. Acute hypoxic respiratory failure requiring oxygen. Patient on 2 L of oxygen currently. Wean off a tolerated  2. Asthmatic bronchitis versus COPD exacerbation in a former smoker.  Clinically improving Solu-Medrol  40 mg IV daily ,taper to po steroids . Continue inhalers prn  3. Clinical sepsis with multifocal pneumonia on previous CAT scan.  chest x-ray showing left upper lobe pneumonia.  Patient is tachycardic and has leukocytosis and tachypneic at admission Rocephin and Zithromax given during the hospital course will discharge patient with p.o. Augmentin  pro-calcitonin <0.10  IV fluid hydration provided  4. Hypokalemia. Oral potassium  supplementation provided  5. Headache -tylenol prn     Doing fine.  Discharge patient home  DISCHARGE CONDITIONS:   stable  CONSULTS OBTAINED:     PROCEDURES  NONE   DRUG ALLERGIES:  No Known Allergies  DISCHARGE MEDICATIONS:   Current Discharge Medication List    START taking these medications   Details  amoxicillin-clavulanate (AUGMENTIN) 875-125 MG tablet Take 1 tablet every 12 (twelve) hours by mouth. Qty: 10 tablet, Refills: 0    dextromethorphan-guaiFENesin (ROBITUSSIN-DM) 10-100 MG/5ML liquid Take 10 mLs every 4 (four) hours as needed by mouth for cough.    feeding supplement, ENSURE ENLIVE, (ENSURE ENLIVE) LIQD Take 237 mLs daily by mouth. Qty: 60 Bottle, Refills: 0      CONTINUE these medications which have CHANGED   Details  albuterol (PROVENTIL HFA;VENTOLIN HFA) 108 (90 Base) MCG/ACT inhaler Inhale 2-4 puffs by mouth every 4 hours as needed for wheezing, cough, and/or shortness of breath Qty: 1 Inhaler, Refills: 1      STOP taking these medications     doxycycline (VIBRAMYCIN) 100 MG capsule          DISCHARGE INSTRUCTIONS:   Follow-up with primary care physician or [facility] in 1 week  DIET:  Regular diet  DISCHARGE CONDITION:  Stable  ACTIVITY:  Activity as tolerated  OXYGEN:  Home Oxygen: No.   Oxygen Delivery: room air  DISCHARGE LOCATION:  home   If you experience worsening of your admission symptoms, develop shortness of breath, life threatening emergency, suicidal or homicidal thoughts you must seek medical attention  immediately by calling 911 or calling your MD immediately  if symptoms less severe.  You Must read complete instructions/literature along with all the possible adverse reactions/side effects for all the Medicines you take and that have been prescribed to you. Take any new Medicines after you have completely understood and accpet all the possible adverse reactions/side effects.   Please  note  You were cared for by a hospitalist during your hospital stay. If you have any questions about your discharge medications or the care you received while you were in the hospital after you are discharged, you can call the unit and asked to speak with the hospitalist on call if the hospitalist that took care of you is not available. Once you are discharged, your primary care physician will handle any further medical issues. Please note that NO REFILLS for any discharge medications will be authorized once you are discharged, as it is imperative that you return to your primary care physician (or establish a relationship with a primary care physician if you do not have one) for your aftercare needs so that they can reassess your need for medications and monitor your lab values.     Today  Chief Complaint  Patient presents with  . Shortness of Breath    Doing fine.  Shortness of breath improved  ROS:  CONSTITUTIONAL: Denies fevers, chills. Denies any fatigue, weakness.  EYES: Denies blurry vision, double vision, eye pain. EARS, NOSE, THROAT: Denies tinnitus, ear pain, hearing loss. RESPIRATORY: Improving cough, denies wheeze, shortness of breath.  CARDIOVASCULAR: Denies chest pain, palpitations, edema.  GASTROINTESTINAL: Denies nausea, vomiting, diarrhea, abdominal pain. Denies bright red blood per rectum. GENITOURINARY: Denies dysuria, hematuria. ENDOCRINE: Denies nocturia or thyroid problems. HEMATOLOGIC AND LYMPHATIC: Denies easy bruising or bleeding. SKIN: Denies rash or lesion. MUSCULOSKELETAL: Denies pain in neck, back, shoulder, knees, hips or arthritic symptoms.  NEUROLOGIC: Denies paralysis, paresthesias.  PSYCHIATRIC: Denies anxiety or depressive symptoms.   VITAL SIGNS:  Blood pressure 125/79, pulse 76, temperature 97.9 F (36.6 C), temperature source Oral, resp. rate 14, height 5\' 2"  (1.575 m), weight 72 kg (158 lb 12.8 oz), SpO2 99 %.  I/O:    Intake/Output Summary  (Last 24 hours) at 09/21/2017 1251 Last data filed at 09/21/2017 1219 Gross per 24 hour  Intake 1203.75 ml  Output -  Net 1203.75 ml    PHYSICAL EXAMINATION:  GENERAL:  41 y.o.-year-old patient lying in the bed with no acute distress.  EYES: Pupils equal, round, reactive to light and accommodation. No scleral icterus. Extraocular muscles intact.  HEENT: Head atraumatic, normocephalic. Oropharynx and nasopharynx clear.  NECK:  Supple, no jugular venous distention. No thyroid enlargement, no tenderness.  LUNGS: Normal breath sounds bilaterally, no wheezing, rales,rhonchi or crepitation. No use of accessory muscles of respiration.  CARDIOVASCULAR: S1, S2 normal. No murmurs, rubs, or gallops.  ABDOMEN: Soft, non-tender, non-distended. Bowel sounds present. No organomegaly or mass.  EXTREMITIES: No pedal edema, cyanosis, or clubbing.  NEUROLOGIC: Cranial nerves II through XII are intact. Muscle strength 5/5 in all extremities. Sensation intact. Gait not checked.  PSYCHIATRIC: The patient is alert and oriented x 3.  SKIN: No obvious rash, lesion, or ulcer.   DATA REVIEW:   CBC Recent Labs  Lab 09/21/17 0514  WBC 15.6*  HGB 13.1  HCT 38.4  PLT 329    Chemistries  Recent Labs  Lab 09/18/17 1036  09/21/17 0514  NA 138   < > 140  K 3.1*   < > 3.4*  CL 105   < > 108  CO2 24   < > 23  GLUCOSE 127*   < > 94  BUN <5*   < > 8  CREATININE 0.47   < > 0.45  CALCIUM 8.9   < > 8.6*  MG  --    < > 1.9  AST 28  --   --   ALT 26  --   --   ALKPHOS 98  --   --   BILITOT 0.9  --   --    < > = values in this interval not displayed.    Cardiac Enzymes No results for input(s): TROPONINI in the last 168 hours.  Microbiology Results  No results found for this or any previous visit.  RADIOLOGY:  Dg Chest 2 View  Result Date: 09/18/2017 CLINICAL DATA:  Shortness of breath with cough EXAM: CHEST  2 VIEW COMPARISON:  Chest CT and chest radiograph September 07, 2017 FINDINGS: There is mild  patchy infiltrate in the medial aspect of the left upper lobe consistent with residual pneumonia. Lungs elsewhere clear. Heart size and pulmonary vascularity are normal. No adenopathy. No bone lesions. IMPRESSION: Persistent patchy infiltrate medial aspect left upper lobe. This finding is felt represent residual pneumonia. Lungs elsewhere clear. Stable cardiac silhouette. Electronically Signed   By: Bretta BangWilliam  Woodruff III M.D.   On: 09/18/2017 11:39    EKG:   Orders placed or performed during the hospital encounter of 09/18/17  . ED EKG  . ED EKG      Management plans discussed with the patient, family and they are in agreement.  CODE STATUS:     Code Status Orders  (From admission, onward)        Start     Ordered   09/18/17 1447  Full code  Continuous     09/18/17 1447    Code Status History    Date Active Date Inactive Code Status Order ID Comments User Context   This patient has a current code status but no historical code status.      TOTAL TIME TAKING CARE OF THIS PATIENT: 45  minutes.   Note: This dictation was prepared with Dragon dictation along with smaller phrase technology. Any transcriptional errors that result from this process are unintentional.   @M[facility] @  on 09/21/2017 at 12:51 PM  Between 7am to 6pm - Pager - [phone]  After 6pm go to [email or web address] - password [email or web address]  [facility] [place]  Office  [phone]  CC: Primary care physician; Patient, No Pcp Per

## 2017-09-21 NOTE — Progress Notes (Signed)
[facility]         [place], [place]   09/21/2017  Patient: Deborah Carney   Date of Birth:  Jul 14, 1976  Date of admission:  09/18/2017  Date of Discharge  09/21/2017    To Whom it May Concern:   Deborah Carney  may return to work on 09/28/17.  PHYSICAL ACTIVITY:  Full  If you have any questions or concerns, please don't hesitate to call.  Sincerely,   Ramonita LabGouru, Karlene Southard M.D Pager [phone] Office : [phone]   .

## 2017-09-21 NOTE — Plan of Care (Signed)
  Progressing Education: Knowledge of General Education information will improve 09/21/2017 1036 - Progressing by Kathreen CosierMalcolm, Dondre Catalfamo A, RN Health Behavior/Discharge Planning: Ability to manage health-related needs will improve 09/21/2017 1036 - Progressing by Kathreen CosierMalcolm, Kevia Zaucha A, RN Clinical Measurements: Ability to maintain clinical measurements within normal limits will improve 09/21/2017 1036 - Progressing by Kathreen CosierMalcolm, Zacharie Portner A, RN Will remain free from infection 09/21/2017 1036 - Progressing by Kathreen CosierMalcolm, Shirley Bolle A, RN Diagnostic test results will improve 09/21/2017 1036 - Progressing by Kathreen CosierMalcolm, Linah Klapper A, RN Respiratory complications will improve 09/21/2017 1036 - Progressing by Kathreen CosierMalcolm, Glory Graefe A, RN Cardiovascular complication will be avoided 09/21/2017 1036 - Progressing by Kathreen CosierMalcolm, Kyrin Gratz A, RN Activity: Risk for activity intolerance will decrease 09/21/2017 1036 - Progressing by Kathreen CosierMalcolm, Willmar Stockinger A, RN Nutrition: Adequate nutrition will be maintained 09/21/2017 1036 - Progressing by Kathreen CosierMalcolm, Taevin Mcferran A, RN Coping: Level of anxiety will decrease 09/21/2017 1036 - Progressing by Kathreen CosierMalcolm, Ninamarie Keel A, RN Elimination: Will not experience complications related to bowel motility 09/21/2017 1036 - Progressing by Kathreen CosierMalcolm, Xzaria Teo A, RN Will not experience complications related to urinary retention 09/21/2017 1036 - Progressing by Kathreen CosierMalcolm, Tavin Vernet A, RN Pain Managment: General experience of comfort will improve 09/21/2017 1036 - Progressing by Kathreen CosierMalcolm, Shenique Childers A, RN Safety: Ability to remain free from injury will improve 09/21/2017 1036 - Progressing by Kathreen CosierMalcolm, Antonio Woodhams A, RN Skin Integrity: Risk for impaired skin integrity will decrease 09/21/2017 1036 - Progressing by Kathreen CosierMalcolm, Manan Olmo A, RN

## 2019-01-05 ENCOUNTER — Encounter: Payer: Self-pay | Admitting: Emergency Medicine

## 2019-01-05 ENCOUNTER — Emergency Department
Admission: EM | Admit: 2019-01-05 | Discharge: 2019-01-05 | Disposition: A | Discharge status: Home / Self Care | Payer: BLUE CROSS/BLUE SHIELD | Attending: Emergency Medicine | Admitting: Emergency Medicine

## 2019-01-05 ENCOUNTER — Other Ambulatory Visit: Payer: Self-pay

## 2019-01-05 ENCOUNTER — Emergency Department: Payer: BLUE CROSS/BLUE SHIELD

## 2019-01-05 DIAGNOSIS — R51 Headache: Secondary | ICD-10-CM | POA: Diagnosis not present

## 2019-01-05 DIAGNOSIS — Y92410 Unspecified street and highway as the place of occurrence of the external cause: Secondary | ICD-10-CM | POA: Diagnosis not present

## 2019-01-05 DIAGNOSIS — Y999 Unspecified external cause status: Secondary | ICD-10-CM | POA: Diagnosis not present

## 2019-01-05 DIAGNOSIS — Z87891 Personal history of nicotine dependence: Secondary | ICD-10-CM | POA: Insufficient documentation

## 2019-01-05 DIAGNOSIS — S0083XA Contusion of other part of head, initial encounter: Secondary | ICD-10-CM | POA: Diagnosis not present

## 2019-01-05 DIAGNOSIS — Y939 Activity, unspecified: Secondary | ICD-10-CM | POA: Diagnosis not present

## 2019-01-05 MED ORDER — HYDROCODONE-ACETAMINOPHEN 5-325 MG PO TABS: 1.0000 | ORAL_TABLET | Freq: Four times a day (QID) | ORAL | 0 refills | Status: DC | PRN

## 2019-01-05 MED ORDER — CYCLOBENZAPRINE HCL 5 MG PO TABS: 5.0000 mg | ORAL_TABLET | Freq: Three times a day (TID) | ORAL | 0 refills | Status: AC

## 2019-01-05 MED ORDER — HYDROCODONE-ACETAMINOPHEN 5-325 MG PO TABS
1.0000 | ORAL_TABLET | Freq: Once | ORAL | Status: AC
  Administered 2019-01-05: 1 via ORAL
  Filled 2019-01-05: qty 1
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
## 2019-01-05 NOTE — Discharge Instructions (Addendum)
Follow-up with your primary care provider or [facility] acute care if any continued problems.  You may apply ice to your face as needed for swelling and to help with pain.  You may take Norco every 6 hours if needed for pain.  Be aware that this medication could cause drowsiness.  Also a muscle relaxant was sent to the pharmacy.  This medication will help with muscle soreness and stiffness.  The 2 together will cause increased drowsiness.
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
## 2019-05-20 ENCOUNTER — Other Ambulatory Visit: Payer: Self-pay

## 2019-05-20 DIAGNOSIS — Z20822 Contact with and (suspected) exposure to covid-19: Secondary | ICD-10-CM

## 2019-05-25 LAB — NOVEL CORONAVIRUS, NAA: SARS-CoV-2, NAA: DETECTED — AB

## 2019-06-06 ENCOUNTER — Other Ambulatory Visit: Payer: Self-pay

## 2019-06-06 DIAGNOSIS — Z20822 Contact with and (suspected) exposure to covid-19: Secondary | ICD-10-CM

## 2019-06-07 LAB — NOVEL CORONAVIRUS, NAA: SARS-CoV-2, NAA: NOT DETECTED

## 2020-07-02 ENCOUNTER — Other Ambulatory Visit: Payer: Self-pay

## 2021-05-15 ENCOUNTER — Other Ambulatory Visit: Payer: Self-pay

## 2021-05-15 ENCOUNTER — Emergency Department: Payer: BC Managed Care – PPO

## 2021-05-15 ENCOUNTER — Emergency Department
Admission: EM | Admit: 2021-05-15 | Discharge: 2021-05-15 | Disposition: A | Discharge status: Home / Self Care | Payer: BC Managed Care – PPO | Attending: Emergency Medicine | Admitting: Emergency Medicine

## 2021-05-15 ENCOUNTER — Encounter: Payer: Self-pay | Admitting: Emergency Medicine

## 2021-05-15 DIAGNOSIS — Z87891 Personal history of nicotine dependence: Secondary | ICD-10-CM | POA: Insufficient documentation

## 2021-05-15 DIAGNOSIS — N83299 Other ovarian cyst, unspecified side: Secondary | ICD-10-CM

## 2021-05-15 DIAGNOSIS — R1031 Right lower quadrant pain: Secondary | ICD-10-CM

## 2021-05-15 DIAGNOSIS — R102 Pelvic and perineal pain: Secondary | ICD-10-CM | POA: Diagnosis not present

## 2021-05-15 LAB — CBC
HCT: 45.5 % (ref 36.0–46.0)
Hemoglobin: 15.6 g/dL — ABNORMAL HIGH (ref 12.0–15.0)
MCH: 32.8 pg (ref 26.0–34.0)
MCHC: 34.3 g/dL (ref 30.0–36.0)
MCV: 95.8 fL (ref 80.0–100.0)
Platelets: 354 10*3/uL (ref 150–400)
RBC: 4.75 MIL/uL (ref 3.87–5.11)
RDW: 14.1 % (ref 11.5–15.5)
WBC: 12.6 10*3/uL — ABNORMAL HIGH (ref 4.0–10.5)
nRBC: 0 % (ref 0.0–0.2)

## 2021-05-15 LAB — COMPREHENSIVE METABOLIC PANEL
ALT: 20 U/L (ref 0–44)
AST: 24 U/L (ref 15–41)
Albumin: 4.2 g/dL (ref 3.5–5.0)
Alkaline Phosphatase: 77 U/L (ref 38–126)
Anion gap: 9 (ref 5–15)
BUN: 6 mg/dL (ref 6–20)
CO2: 23 mmol/L (ref 22–32)
Calcium: 9 mg/dL (ref 8.9–10.3)
Chloride: 104 mmol/L (ref 98–111)
Creatinine, Ser: 0.38 mg/dL — ABNORMAL LOW (ref 0.44–1.00)
GFR, Estimated: >60 mL/min (ref >60)
Glucose, Bld: 107 mg/dL — ABNORMAL HIGH (ref 70–99)
Potassium: 3.7 mmol/L (ref 3.5–5.1)
Sodium: 136 mmol/L (ref 135–145)
Total Bilirubin: 0.7 mg/dL (ref 0.3–1.2)
Total Protein: 7.7 g/dL (ref 6.5–8.1)

## 2021-05-15 LAB — URINALYSIS, COMPLETE (UACMP) WITH MICROSCOPIC
Bilirubin Urine: NEGATIVE
Glucose, UA: NEGATIVE mg/dL
Hgb urine dipstick: NEGATIVE
Ketones, ur: NEGATIVE mg/dL
Leukocytes,Ua: NEGATIVE
Nitrite: NEGATIVE
Protein, ur: NEGATIVE mg/dL
Specific Gravity, Urine: 1.003 — ABNORMAL LOW (ref 1.005–1.030)
pH: 7 (ref 5.0–8.0)

## 2021-05-15 LAB — LIPASE, BLOOD: Lipase: 39 U/L (ref 11–51)

## 2021-05-15 MED ORDER — HYDROMORPHONE HCL 1 MG/ML IJ SOLN
0.5000 mg | Freq: Once | INTRAMUSCULAR | Status: AC
  Administered 2021-05-15: 0.5 mg via INTRAVENOUS
  Filled 2021-05-15: qty 1

## 2021-05-15 MED ORDER — FENTANYL CITRATE (PF) 100 MCG/2ML IJ SOLN
50.0000 ug | Freq: Once | INTRAMUSCULAR | Status: AC
Start: 2021-05-15 — End: 2021-05-15
  Administered 2021-05-15: 50 ug via INTRAVENOUS
  Filled 2021-05-15: qty 2

## 2021-05-15 MED ORDER — ONDANSETRON HCL 4 MG/2ML IJ SOLN
4.0000 mg | Freq: Once | INTRAMUSCULAR | Status: AC
  Administered 2021-05-15: 4 mg via INTRAVENOUS
  Filled 2021-05-15: qty 2

## 2021-05-15 MED ORDER — OXYCODONE-ACETAMINOPHEN 5-325 MG PO TABS: 1.0000 | ORAL_TABLET | ORAL | 0 refills | Status: DC | PRN

## 2021-05-15 MED ORDER — IOHEXOL 300 MG/ML  SOLN
75.0000 mL | Freq: Once | INTRAMUSCULAR | Status: AC | PRN
  Administered 2021-05-15: 75 mL via INTRAVENOUS
  Filled 2021-05-15: qty 75

## 2021-05-15 MED ORDER — OXYCODONE-ACETAMINOPHEN 5-325 MG PO TABS: 1.0000 | ORAL_TABLET | ORAL | 0 refills | Status: AC | PRN

## 2021-05-15 MED ORDER — MORPHINE SULFATE (PF) 4 MG/ML IV SOLN
4.0000 mg | Freq: Once | INTRAVENOUS | Status: AC
  Administered 2021-05-15: 4 mg via INTRAVENOUS
  Filled 2021-05-15: qty 1

## 2021-05-15 NOTE — ED Triage Notes (Signed)
Pt comes into the ED via [facility] c/o possible appendicitis.  Pt c/o RLQ abdominal pain that radiates into the right side of the back as well as some left side referred pain.  Pt denies any known history of kidney stones.  Pt states she does have some mild nausea.  Pt still has appendix and gallbladder.  Pt ambulatory to triage with even and unlabored respirations./

## 2021-05-15 NOTE — Discharge Instructions (Addendum)
You had a complex cyst of the right ovary.  It may not be anything but it could possibly be a cancer.  I have discussed you with Dr. Feliberto Gottron.  He wants you to call the office to arrange follow-up tomorrow or Friday.  I have provided his contact information.  I tried to call the office now to schedule your appointment but they are already closed.  When you call the office tomorrow morning let them know that he wanted to see you in the next couple days.  If you have a problem call us back here.  Please return here for fever vomiting lightheadedness or worse pain.  Take the Percocet 1 or 2 pills 4 times a day as needed for pain.  Be careful can make you sleepy and constipated.  Do not drive if you are taking it.  Do not operate hazardous machinery if you take it.

## 2021-05-15 NOTE — ED Provider Notes (Addendum)
Asked to see patient by her PA.  Patient is having right-sided pain.  Ultrasound showed no torsion but the right ovary is replaced by the complicated cyst.  RIGHT ovary replaced by a complicated cystic lesion which contain internal echogenicity, irregular septations, and a mural nodule 8 mm diameter; cystic ovarian neoplasm not excluded and surgical evaluation is recommended.   No evidence of RIGHT ovarian torsion.   Arnaldo Natal, MD 05/15/21 1705 Patient discussed in detail with Dr. Feliberto Gottron.  He will follow-up the patient he said the next couple days.  Patient will get some pain medicine in the meantime and will return for any increasing pain fever nausea vomiting weakness or any other complaints.   Arnaldo Natal, MD 05/15/21 Barry Brunner

## 2021-05-15 NOTE — ED Notes (Signed)
Pt states her pain is a 10 in her RLQ and lower back. RN notified.

## 2021-05-15 NOTE — ED Notes (Signed)
Pt returned from ultrasound

## 2021-05-15 NOTE — ED Notes (Signed)
PA notified of pt c/o pain per previous note.

## 2021-05-15 NOTE — ED Provider Notes (Signed)
[facility] Emergency Department Provider Note  ____________________________________________   Event Date/Time   First MD Initiated Contact with Patient 05/15/21 1233     (approximate)  I have reviewed the triage vital signs and the nursing notes.   HISTORY  Chief Complaint Abdominal Pain and Back Pain    HPI ANNALENA PIATT is a 45 y.o. female presents emergency department complaining of right lower quadrant pain.  Pain started yesterday.  She has had decreased appetite.  Some nausea but no vomiting or diarrhea.  Patient states she does still have an appendix and gallbladder.  Had a hysterectomy due to ovarian cyst.  Pain is rated at 10/10.  Past Medical History:  Diagnosis Date   Medical history non-contributory     Patient Active Problem List   Diagnosis Date Noted   Acute respiratory failure with hypoxia (HCC) 09/18/2017    Past Surgical History:  Procedure Laterality Date   OOPHORECTOMY Left    PARTIAL HYSTERECTOMY      Prior to Admission medications   Medication Sig Start Date End Date Taking? Authorizing Provider  albuterol (PROVENTIL HFA;VENTOLIN HFA) 108 (90 Base) MCG/ACT inhaler Inhale 2-4 puffs by mouth every 4 hours as needed for wheezing, cough, and/or shortness of breath 09/21/17   Gouru, Aruna, MD  cyclobenzaprine (FLEXERIL) 5 MG tablet Take 1 tablet (5 mg total) by mouth 3 (three) times daily. 01/05/19   Tommi Rumps, PA-C  HYDROcodone-acetaminophen (NORCO/VICODIN) 5-325 MG tablet Take 1 tablet by mouth every 6 (six) hours as needed for moderate pain. 01/05/19   Tommi Rumps, PA-C    Allergies Patient has no known allergies.  Family History  Problem Relation Age of Onset   CAD Mother    Diabetes Mother    Lung cancer Father    CVA Father    COPD Father     Social History Social History   Tobacco Use   Smoking status: Former    Pack years: 0.00    Types: Cigarettes    Quit date: 2014    Years since quitting:  8.5   Smokeless tobacco: Never  Vaping Use   Vaping Use: Never used  Substance Use Topics   Alcohol use: No   Drug use: No    Review of Systems  Constitutional: No fever/chills Eyes: No visual changes. ENT: No sore throat. Respiratory: Denies cough Cardiovascular: Denies chest pain Gastrointestinal: Positive for right lower quadrant abdominal pain Genitourinary: Negative for dysuria. Musculoskeletal: Negative for back pain. Skin: Negative for rash. Psychiatric: no mood changes,     ____________________________________________   PHYSICAL EXAM:  VITAL SIGNS: ED Triage Vitals  Enc Vitals Group     BP 05/15/21 1208 (!) 155/97     Pulse Rate 05/15/21 1208 89     Resp 05/15/21 1208 16     Temp 05/15/21 1208 98 F (36.7 C)     Temp Source 05/15/21 1208 Oral     SpO2 05/15/21 1208 97 %     Weight 05/15/21 1211 138 lb 0.1 oz (62.6 kg)     Height 05/15/21 1211 5\' 2"  (1.575 m)     Head Circumference --      Peak Flow --      Pain Score 05/15/21 1211 8     Pain Loc --      Pain Edu? --      Excl. in GC? --     Constitutional: Alert and oriented. Well appearing and in no acute distress.  Eyes: Conjunctivae are normal.  Head: Atraumatic. Nose: No congestion/rhinnorhea. Mouth/Throat: Mucous membranes are moist.   Neck:  supple no lymphadenopathy noted Cardiovascular: Normal rate, regular rhythm. Heart sounds are normal Respiratory: Normal respiratory effort.  No retractions, lungs c t a  Abd: soft tender in the right lower quadrant, bs normal all 4 quad GU: deferred Musculoskeletal: FROM all extremities, warm and well perfused Neurologic:  Normal speech and language.  Skin:  Skin is warm, dry and intact. No rash noted. Psychiatric: Mood and affect are normal. Speech and behavior are normal.  ____________________________________________   LABS (all labs ordered are listed, but only abnormal results are displayed)  Labs Reviewed  COMPREHENSIVE METABOLIC PANEL -  Abnormal; Notable for the following components:      Result Value   Glucose, Bld 107 (*)    Creatinine, Ser 0.38 (*)    All other components within normal limits  CBC - Abnormal; Notable for the following components:   WBC 12.6 (*)    Hemoglobin 15.6 (*)    All other components within normal limits  URINALYSIS, COMPLETE (UACMP) WITH MICROSCOPIC - Abnormal; Notable for the following components:   Color, Urine STRAW (*)    APPearance CLEAR (*)    Specific Gravity, Urine 1.003 (*)    Bacteria, UA RARE (*)    All other components within normal limits  LIPASE, BLOOD   ____________________________________________   ____________________________________________  RADIOLOGY  CT abdomen/pelvis with IV contrast  ____________________________________________   PROCEDURES  Procedure(s) performed: No  Procedures    ____________________________________________   INITIAL IMPRESSION / ASSESSMENT AND PLAN / ED COURSE  Pertinent labs & imaging results that were available during my care of the patient were reviewed by me and considered in my medical decision making (see chart for details).   The patient is a 45 year old female presents with right lower quadrant pain.  See HPI.  Physical exam shows patient appears stable although in a great deal of pain.  DDx: Acute appendicitis, ruptured ovarian cyst, bowel obstruction, kidney stone, pyelonephritis  Cbc  has mild elevation of the WBC of 12.6,  Metabolic panel and urinalysis normal, lipase is normal  CT abdomen/pelvis to rule out appendicitis was reviewed by me and confirmed by radiology to be negative  Ultrasound of the pelvis  Patient was given additional pain medication  Care transferred to Dr. Juliette Alcide at this time.      MALEYA LEEVER was evaluated in Emergency Department on 05/15/2021 for the symptoms described in the history of present illness. She was evaluated in the context of the global COVID-19 pandemic, which  necessitated consideration that the patient might be at risk for infection with the SARS-CoV-2 virus that causes COVID-19. Institutional protocols and algorithms that pertain to the evaluation of patients at risk for COVID-19 are in a state of rapid change based on information released by regulatory bodies including the CDC and federal and state organizations. These policies and algorithms were followed during the patient's care in the ED.    As part of my medical decision making, I reviewed the following data within the electronic MEDICAL RECORD NUMBER Nursing notes reviewed and incorporated, Labs reviewed , Old chart reviewed, Radiograph reviewed , Evaluated by EM attending , Notes from prior ED visits, and [place] Controlled Substance Database  ____________________________________________   FINAL CLINICAL IMPRESSION(S) / ED DIAGNOSES  Final diagnoses:  Pelvic pain      NEW MEDICATIONS STARTED DURING THIS VISIT:  New Prescriptions   No medications on  file     Note:  This document was prepared using Dragon voice recognition software and may include unintentional dictation errors.    Faythe Ghee, PA-C 05/15/21 1621    Arnaldo Natal, MD 05/15/21 [phone]

## 2021-05-15 NOTE — ED Notes (Signed)
Pt transported to ultrasound.

## 2021-05-16 NOTE — H&P (Signed)
Deborah Carney is a 45 y.o. female here for  L/S right salpingoophorectomy . Pt is with right sided pain with a complex  Right ovarian cyst. Pain start 2 days ago and has been constant requiring narcotics . She has had issues with cyst for years and underwent a hysterectomy with left oophorectomy 2016   U/s in ED showed :    COMPARISON:  CT abdomen and pelvis 05/15/2021   FINDINGS:  Uterus   Surgically absent   Endometrium   Surgically absent   Right ovary   Measurements: 5.9 x 3.1 x 6.3 cm = volume: 60 mL. Enlarged by a  complex cystic lesion, with 2 dominant components which measure 3.4  x 3.5 x 3.6 cm and 3.1 x 2.7 x 2.5 cm. The smaller cystic lesion has  a dependent fluid debris level and scattered internal echoes as well  as a single thick septation. The larger cystic lesion contains  partial septations which are irregular and thick as well as a mural  nodule 8 x 6 mm in size. Cystic ovarian neoplasm not excluded. Blood  flow present within RIGHT ovary on color Doppler imaging.   Left ovary   Not visualized, patient states surgically absent   Pulsed Doppler evaluation demonstrates normal low-resistance  arterial and venous waveforms within RIGHT ovary.   Other findings   No free pelvic fluid.  No other pelvic masses.   ctscan ruled out appendicitis   Past Medical History:  has a past medical history of Anxiety.  Past Surgical History:  has a past surgical history that includes Laparoscopic ovarian cystectomy and Hysterectomy (2017). Family History: family history is not on file. Social History:  reports that she has been smoking. She has never used smokeless tobacco. She reports previous alcohol use. OB/GYN History:          OB History     Gravida  5   Para  4   Term  4   Preterm      AB  1   Living  4      SAB      IAB      Ectopic      Molar      Multiple      Live Births  4             Allergies: has No Known Allergies. Medications:    Current Outpatient Medications:    oxyCODONE-acetaminophen (PERCOCET) 5-325 mg tablet, Take by mouth, Disp: , Rfl:    gabapentin (NEURONTIN) 300 MG capsule, Take 1 capsule (300 mg total) by mouth nightly for 10 days, Disp: 10 capsule, Rfl: 0   ibuprofen (MOTRIN) 800 MG tablet, Take 1 tablet (800 mg total) by mouth every 8 (eight) hours as needed for Pain, Disp: 30 tablet, Rfl: 1   ondansetron (ZOFRAN) 8 MG tablet, Take 1 tablet (8 mg total) by mouth every 8 (eight) hours as needed for Nausea, Disp: 30 tablet, Rfl: 0   oxyCODONE-acetaminophen (PERCOCET) 5-325 mg tablet, 1 po q 4-6 hours prn pain, Disp: 20 tablet, Rfl: 0   sertraline (ZOLOFT) 100 MG tablet, Take 200 mg by mouth once daily, Disp: , Rfl:    Review of Systems: General:                      No fatigue or weight loss Eyes:  No vision changes Ears:                            No hearing difficulty Respiratory:                No cough or shortness of breath Pulmonary:                  No asthma or shortness of breath Cardiovascular:           No chest pain, palpitations, dyspnea on exertion Gastrointestinal:          No abdominal bloating, chronic diarrhea, constipations, masses, pain or hematochezia, + abdominal pain  Genitourinary:             No hematuria, dysuria, abnormal vaginal discharge, +pelvic pain, Menometrorrhagia Lymphatic:                   No swollen lymph nodes Musculoskeletal:         No muscle weakness Neurologic:                  No extremity weakness, syncope, seizure disorder Psychiatric:                  No history of depression, delusions or suicidal/homicidal ideation      Exam:       Vitals:    05/16/21 1340  BP: 127/88  Pulse: 98      Body mass index is 26.52 kg/m.   WDWN white/   female in NAD   Lungs: CTA  CV : RRR without murmur       Neck:  no thyromegaly Abdomen: soft , no mass, normal active bowel sounds,  Diffuse tenderness throughout , no rebound  tenderness Pelvic: tanner stage 5 ,  External genitalia: vulva /labia no lesions Urethra: no prolapse Vagina: normal physiologic d/c Cervix: absent Uterus: absent Adnexa: no mass, TTP right    Rectovaginal:   Impression:    The primary encounter diagnosis was Acute pelvic pain, female. A diagnosis of Complex ovarian cyst was also pertinent to this visit.       Plan:    Given the patients pain I have spoken with her about surgery . Options of right ovarian cystectomies or definitive L/S RSO . She opts for the latter . 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  Benefits and risks to surgery: The proposed benefit of the surgery has been discussed with the patient. The possible risks include, but are not limited to: organ injury to the bowel , bladder, ureters, and major blood vessels and nerves. There is a possibility of additional surgeries resulting from these injuries. There is also the risk of blood transfusion and the need to receive blood products during or after the procedure which may rarely lead to HIV or Hepatitis C infection. There is a risk of developing a deep venous thrombosis or a pulmonary embolism . There is the possibility of wound infection and also anesthetic complications, even the rare possibility of death. The patient understands these risks and wishes to proceed. All questions have been answered and the consent has been signed.       No follow-ups on file.   Vilma Prader, MD

## 2021-05-17 ENCOUNTER — Ambulatory Visit: Payer: BC Managed Care – PPO | Admitting: Anesthesiology

## 2021-05-17 ENCOUNTER — Encounter
Admission: RE | Disposition: A | Discharge status: Home / Self Care | Payer: Self-pay | Source: Home / Self Care | Attending: Obstetrics and Gynecology

## 2021-05-17 ENCOUNTER — Encounter: Payer: Self-pay | Admitting: Obstetrics and Gynecology

## 2021-05-17 ENCOUNTER — Ambulatory Visit
Admission: RE | Admit: 2021-05-17 | Discharge: 2021-05-17 | Disposition: A | Discharge status: Home / Self Care | Payer: BC Managed Care – PPO | Attending: Obstetrics and Gynecology | Admitting: Obstetrics and Gynecology

## 2021-05-17 DIAGNOSIS — N83511 Torsion of right ovary and ovarian pedicle: Secondary | ICD-10-CM | POA: Insufficient documentation

## 2021-05-17 DIAGNOSIS — Z791 Long term (current) use of non-steroidal anti-inflammatories (NSAID): Secondary | ICD-10-CM | POA: Diagnosis not present

## 2021-05-17 DIAGNOSIS — N8301 Follicular cyst of right ovary: Secondary | ICD-10-CM | POA: Insufficient documentation

## 2021-05-17 DIAGNOSIS — Z79899 Other long term (current) drug therapy: Secondary | ICD-10-CM | POA: Diagnosis not present

## 2021-05-17 DIAGNOSIS — N83201 Unspecified ovarian cyst, right side: Secondary | ICD-10-CM | POA: Insufficient documentation

## 2021-05-17 DIAGNOSIS — F17200 Nicotine dependence, unspecified, uncomplicated: Secondary | ICD-10-CM | POA: Diagnosis not present

## 2021-05-17 DIAGNOSIS — R102 Pelvic and perineal pain: Secondary | ICD-10-CM | POA: Insufficient documentation

## 2021-05-17 DIAGNOSIS — N8311 Corpus luteum cyst of right ovary: Secondary | ICD-10-CM | POA: Diagnosis not present

## 2021-05-17 HISTORY — PX: LAPAROSCOPIC UNILATERAL SALPINGECTOMY: SHX5934

## 2021-05-17 LAB — BASIC METABOLIC PANEL
Anion gap: 8 (ref 5–15)
BUN: 5 mg/dL — ABNORMAL LOW (ref 6–20)
CO2: 26 mmol/L (ref 22–32)
Calcium: 8.9 mg/dL (ref 8.9–10.3)
Chloride: 102 mmol/L (ref 98–111)
Creatinine, Ser: 0.44 mg/dL (ref 0.44–1.00)
GFR, Estimated: >60 mL/min (ref >60)
Glucose, Bld: 97 mg/dL (ref 70–99)
Potassium: 3.7 mmol/L (ref 3.5–5.1)
Sodium: 136 mmol/L (ref 135–145)

## 2021-05-17 LAB — TYPE AND SCREEN
ABO/RH(D): O POS
Antibody Screen: NEGATIVE

## 2021-05-17 LAB — CBC
HCT: 42.5 % (ref 36.0–46.0)
Hemoglobin: 14.7 g/dL (ref 12.0–15.0)
MCH: 33 pg (ref 26.0–34.0)
MCHC: 34.6 g/dL (ref 30.0–36.0)
MCV: 95.5 fL (ref 80.0–100.0)
Platelets: 349 10*3/uL (ref 150–400)
RBC: 4.45 MIL/uL (ref 3.87–5.11)
RDW: 14 % (ref 11.5–15.5)
WBC: 10.6 10*3/uL — ABNORMAL HIGH (ref 4.0–10.5)
nRBC: 0 % (ref 0.0–0.2)

## 2021-05-17 SURGERY — SALPINGECTOMY, UNILATERAL, LAPAROSCOPIC
Anesthesia: General | Laterality: Right

## 2021-05-17 MED ORDER — MIDAZOLAM HCL 2 MG/2ML IJ SOLN
INTRAMUSCULAR | Status: AC
  Filled 2021-05-17: qty 2

## 2021-05-17 MED ORDER — KETOROLAC TROMETHAMINE 30 MG/ML IJ SOLN: 30.0000 mg | Freq: Once | INTRAMUSCULAR | Status: AC

## 2021-05-17 MED ORDER — MIDAZOLAM HCL 2 MG/2ML IJ SOLN
INTRAMUSCULAR | Status: DC | PRN
  Administered 2021-05-17: 2 mg via INTRAVENOUS

## 2021-05-17 MED ORDER — DEXAMETHASONE SODIUM PHOSPHATE 10 MG/ML IJ SOLN
INTRAMUSCULAR | Status: DC | PRN
  Administered 2021-05-17: 10 mg via INTRAVENOUS

## 2021-05-17 MED ORDER — FENTANYL CITRATE (PF) 100 MCG/2ML IJ SOLN
INTRAMUSCULAR | Status: DC | PRN
  Administered 2021-05-17: 50 ug via INTRAVENOUS

## 2021-05-17 MED ORDER — ROCURONIUM BROMIDE 100 MG/10ML IV SOLN: INTRAVENOUS | Status: DC | PRN

## 2021-05-17 MED ORDER — ROCURONIUM BROMIDE 10 MG/ML (PF) SYRINGE
PREFILLED_SYRINGE | INTRAVENOUS | Status: AC
  Filled 2021-05-17: qty 10

## 2021-05-17 MED ORDER — OXYCODONE-ACETAMINOPHEN 5-325 MG PO TABS: 1.0000 | ORAL_TABLET | Freq: Once | ORAL | Status: DC

## 2021-05-17 MED ORDER — ROCURONIUM BROMIDE 100 MG/10ML IV SOLN
INTRAVENOUS | Status: DC | PRN
  Administered 2021-05-17: 50 mg via INTRAVENOUS

## 2021-05-17 MED ORDER — FENTANYL CITRATE (PF) 100 MCG/2ML IJ SOLN
INTRAMUSCULAR | Status: AC
  Filled 2021-05-17: qty 2

## 2021-05-17 MED ORDER — LACTATED RINGERS IV SOLN: INTRAVENOUS | Status: DC

## 2021-05-17 MED ORDER — SILVER NITRATE-POT NITRATE 75-25 % EX MISC
CUTANEOUS | Status: AC
  Filled 2021-05-17: qty 10

## 2021-05-17 MED ORDER — LIDOCAINE HCL (CARDIAC) PF 100 MG/5ML IV SOSY
PREFILLED_SYRINGE | INTRAVENOUS | Status: DC | PRN
  Administered 2021-05-17: 100 mg via INTRAVENOUS

## 2021-05-17 MED ORDER — ONDANSETRON HCL 4 MG/2ML IJ SOLN
INTRAMUSCULAR | Status: AC
  Filled 2021-05-17: qty 2

## 2021-05-17 MED ORDER — PROPOFOL 10 MG/ML IV BOLUS
INTRAVENOUS | Status: DC | PRN
  Administered 2021-05-17: 150 mg via INTRAVENOUS

## 2021-05-17 MED ORDER — MEPERIDINE HCL 25 MG/ML IJ SOLN: 6.2500 mg | INTRAMUSCULAR | Status: DC | PRN

## 2021-05-17 MED ORDER — PROMETHAZINE HCL 25 MG/ML IJ SOLN
6.2500 mg | INTRAMUSCULAR | Status: DC | PRN
Start: 2021-05-17 — End: 2021-05-18

## 2021-05-17 MED ORDER — ONDANSETRON HCL 4 MG/2ML IJ SOLN
INTRAMUSCULAR | Status: DC | PRN
  Administered 2021-05-17: 4 mg via INTRAVENOUS

## 2021-05-17 MED ORDER — ACETAMINOPHEN 500 MG PO TABS
ORAL_TABLET | ORAL | Status: AC
  Administered 2021-05-17: 1000 mg via ORAL
  Filled 2021-05-17: qty 2

## 2021-05-17 MED ORDER — HYDROMORPHONE HCL 1 MG/ML IJ SOLN
0.5000 mg | INTRAMUSCULAR | Status: AC | PRN
Start: 2021-05-17 — End: 2021-05-17
  Administered 2021-05-17 (×2): 0.5 mg via INTRAVENOUS

## 2021-05-17 MED ORDER — GABAPENTIN 300 MG PO CAPS: 300.0000 mg | ORAL_CAPSULE | ORAL | Status: AC

## 2021-05-17 MED ORDER — 0.9 % SODIUM CHLORIDE (POUR BTL) OPTIME
TOPICAL | Status: DC | PRN
  Administered 2021-05-17: 500 mL

## 2021-05-17 MED ORDER — HYDRALAZINE HCL 20 MG/ML IJ SOLN
10.0000 mg | Freq: Once | INTRAMUSCULAR | Status: AC
  Administered 2021-05-17: 10 mg via INTRAVENOUS

## 2021-05-17 MED ORDER — KETOROLAC TROMETHAMINE 30 MG/ML IJ SOLN
INTRAMUSCULAR | Status: AC
  Administered 2021-05-17: 30 mg via INTRAVENOUS
  Filled 2021-05-17: qty 1

## 2021-05-17 MED ORDER — FENTANYL CITRATE (PF) 100 MCG/2ML IJ SOLN
INTRAMUSCULAR | Status: AC
  Administered 2021-05-17: 25 ug via INTRAVENOUS
  Filled 2021-05-17: qty 2

## 2021-05-17 MED ORDER — HYDRALAZINE HCL 20 MG/ML IJ SOLN
INTRAMUSCULAR | Status: AC
  Filled 2021-05-17: qty 1

## 2021-05-17 MED ORDER — FENTANYL CITRATE (PF) 100 MCG/2ML IJ SOLN
25.0000 ug | INTRAMUSCULAR | Status: DC | PRN
  Administered 2021-05-17 (×2): 25 ug via INTRAVENOUS
  Administered 2021-05-17: 50 ug via INTRAVENOUS

## 2021-05-17 MED ORDER — PHENYLEPHRINE HCL (PRESSORS) 10 MG/ML IV SOLN
INTRAVENOUS | Status: DC | PRN
  Administered 2021-05-17: 100 ug via INTRAVENOUS

## 2021-05-17 MED ORDER — GABAPENTIN 300 MG PO CAPS
ORAL_CAPSULE | ORAL | Status: AC
  Administered 2021-05-17: 300 mg via ORAL
  Filled 2021-05-17: qty 1

## 2021-05-17 MED ORDER — HYDROMORPHONE HCL 1 MG/ML IJ SOLN
INTRAMUSCULAR | Status: AC
  Administered 2021-05-17: 0.5 mg via INTRAVENOUS
  Filled 2021-05-17: qty 1

## 2021-05-17 MED ORDER — BUPIVACAINE HCL (PF) 0.5 % IJ SOLN
INTRAMUSCULAR | Status: AC
  Filled 2021-05-17: qty 30

## 2021-05-17 MED ORDER — CHLORHEXIDINE GLUCONATE 0.12 % MT SOLN: 15.0000 mL | Freq: Once | OROMUCOSAL | Status: DC

## 2021-05-17 MED ORDER — SEVOFLURANE IN SOLN
RESPIRATORY_TRACT | Status: AC
  Filled 2021-05-17: qty 250

## 2021-05-17 MED ORDER — OXYCODONE HCL 5 MG PO TABS
5.0000 mg | ORAL_TABLET | Freq: Once | ORAL | Status: AC | PRN
Start: 2021-05-17 — End: 2021-05-17
  Administered 2021-05-17: 5 mg via ORAL

## 2021-05-17 MED ORDER — CEFAZOLIN SODIUM-DEXTROSE 2-4 GM/100ML-% IV SOLN
INTRAVENOUS | Status: AC
  Filled 2021-05-17: qty 100

## 2021-05-17 MED ORDER — SUGAMMADEX SODIUM 200 MG/2ML IV SOLN
INTRAVENOUS | Status: DC | PRN
  Administered 2021-05-17: 200 mg via INTRAVENOUS

## 2021-05-17 MED ORDER — OXYCODONE HCL 5 MG PO TABS
ORAL_TABLET | ORAL | Status: AC
  Filled 2021-05-17: qty 1

## 2021-05-17 MED ORDER — ORAL CARE MOUTH RINSE: 15.0000 mL | Freq: Once | OROMUCOSAL | Status: DC

## 2021-05-17 MED ORDER — ACETAMINOPHEN 500 MG PO TABS: 1000.0000 mg | ORAL_TABLET | ORAL | Status: AC

## 2021-05-17 MED ORDER — BUPIVACAINE HCL 0.5 % IJ SOLN
INTRAMUSCULAR | Status: DC | PRN
  Administered 2021-05-17: 16 mL

## 2021-05-17 MED ORDER — ONDANSETRON HCL 4 MG PO TABS: 8.0000 mg | ORAL_TABLET | Freq: Three times a day (TID) | ORAL | Status: DC | PRN

## 2021-05-17 MED ORDER — LIDOCAINE HCL (PF) 2 % IJ SOLN
INTRAMUSCULAR | Status: AC
  Filled 2021-05-17: qty 5

## 2021-05-17 MED ORDER — POVIDONE-IODINE 10 % EX SWAB: 2.0000 "application " | Freq: Once | CUTANEOUS | Status: DC

## 2021-05-17 MED ORDER — DEXAMETHASONE SODIUM PHOSPHATE 10 MG/ML IJ SOLN
INTRAMUSCULAR | Status: AC
  Filled 2021-05-17: qty 1

## 2021-05-17 MED ORDER — OXYCODONE HCL 5 MG/5ML PO SOLN: 5.0000 mg | Freq: Once | ORAL | Status: AC | PRN

## 2021-05-17 SURGICAL SUPPLY — 39 items
BACTOSHIELD CHG 4% 4OZ (MISCELLANEOUS) ×1
BAG URINE DRAIN 2000ML AR STRL (UROLOGICAL SUPPLIES) ×2 IMPLANT
BLADE SURG SZ11 CARB STEEL (BLADE) ×2 IMPLANT
CANISTER SUCT 1200ML W/VALVE (MISCELLANEOUS) IMPLANT
CATH ROBINSON RED A/P 16FR (CATHETERS) ×2 IMPLANT
CHLORAPREP W/TINT 26 (MISCELLANEOUS) ×2 IMPLANT
DRSG TEGADERM 2-3/8X2-3/4 SM (GAUZE/BANDAGES/DRESSINGS) ×4 IMPLANT
GAUZE 4X4 16PLY ~~LOC~~+RFID DBL (SPONGE) ×2 IMPLANT
GLOVE SURG SYN 8.0 (GLOVE) ×2 IMPLANT
GOWN STRL REUS W/ TWL LRG LVL3 (GOWN DISPOSABLE) ×2 IMPLANT
GOWN STRL REUS W/ TWL XL LVL3 (GOWN DISPOSABLE) ×1 IMPLANT
GOWN STRL REUS W/TWL LRG LVL3 (GOWN DISPOSABLE) ×2
GOWN STRL REUS W/TWL XL LVL3 (GOWN DISPOSABLE) ×1
GRASPER SUT TROCAR 14GX15 (MISCELLANEOUS) ×2 IMPLANT
IRRIGATION STRYKERFLOW (MISCELLANEOUS) IMPLANT
IRRIGATOR STRYKERFLOW (MISCELLANEOUS)
IV NS 1000ML (IV SOLUTION) ×1
IV NS 1000ML BAXH (IV SOLUTION) ×1 IMPLANT
KIT TURNOVER CYSTO (KITS) ×2 IMPLANT
LABEL OR SOLS (LABEL) ×2 IMPLANT
MANIFOLD NEPTUNE II (INSTRUMENTS) ×2 IMPLANT
NS IRRIG 500ML POUR BTL (IV SOLUTION) ×2 IMPLANT
PACK GYN LAPAROSCOPIC (MISCELLANEOUS) ×2 IMPLANT
PAD OB MATERNITY 4.3X12.25 (PERSONAL CARE ITEMS) ×2 IMPLANT
PAD PREP 24X41 OB/GYN DISP (PERSONAL CARE ITEMS) ×2 IMPLANT
POUCH SPECIMEN RETRIEVAL 10MM (ENDOMECHANICALS) ×2 IMPLANT
SCRUB CHG 4% DYNA-HEX 4OZ (MISCELLANEOUS) ×1 IMPLANT
SET TUBE SMOKE EVAC HIGH FLOW (TUBING) ×2 IMPLANT
SHEARS HARMONIC ACE PLUS 36CM (ENDOMECHANICALS) ×2 IMPLANT
SLEEVE ENDOPATH XCEL 5M (ENDOMECHANICALS) ×2 IMPLANT
SPONGE GAUZE 2X2 8PLY STRL LF (GAUZE/BANDAGES/DRESSINGS) ×4 IMPLANT
STRIP CLOSURE SKIN 1/4X4 (GAUZE/BANDAGES/DRESSINGS) ×2 IMPLANT
SUT VIC AB 0 CT1 36 (SUTURE) ×2 IMPLANT
SUT VIC AB 2-0 UR6 27 (SUTURE) ×2 IMPLANT
SUT VIC AB 4-0 SH 27 (SUTURE)
SUT VIC AB 4-0 SH 27XANBCTRL (SUTURE) IMPLANT
SWABSTK COMLB BENZOIN TINCTURE (MISCELLANEOUS) ×2 IMPLANT
TROCAR ENDO BLADELESS 11MM (ENDOMECHANICALS) ×2 IMPLANT
TROCAR XCEL NON-BLD 5MMX100MML (ENDOMECHANICALS) ×2 IMPLANT

## 2021-05-17 NOTE — Brief Op Note (Signed)
05/17/2021  7:04 PM  PATIENT:  Deborah Carney  45 y.o. female  PRE-OPERATIVE DIAGNOSIS:  Right ovarian cyst; pelvic pain  POST-OPERATIVE DIAGNOSIS:  Right ovarian cyst; , right ovarian torsion   PROCEDURE:  Procedure(s): LAPAROSCOPIC UNILATERAL SALPINGOOPHERECTOMY (Right)  SURGEON:  Surgeon(s) and Role:    * Sopheap Basic, Ihor Austin, MD - Primary    * Christeen Douglas, MD - Assisting  PHYSICIAN ASSISTANT:   ASSISTANTS: none   ANESTHESIA:   general  EBL:  10 mL   BLOOD ADMINISTERED:none  DRAINS: none   LOCAL MEDICATIONS USED:  MARCAINE     SPECIMEN:  Source of Specimen:  right tube and ovary   DISPOSITION OF SPECIMEN:  PATHOLOGY  COUNTS:  YES  TOURNIQUET:  * No tourniquets in log *  DICTATION: .Other Dictation: Dictation Number verbal  PLAN OF CARE: Discharge to home after PACU  PATIENT DISPOSITION:  PACU - hemodynamically stable.   Delay start of Pharmacological VTE agent (>24hrs) due to surgical blood loss or risk of bleeding: not applicable

## 2021-05-17 NOTE — Anesthesia Procedure Notes (Signed)
Procedure Name: Intubation Date/Time: 05/17/2021 6:08 PM Performed by: Junious Silk, CRNA Pre-anesthesia Checklist: Patient identified, Patient being monitored, Timeout performed, Emergency Drugs available and Suction available Patient Re-evaluated:Patient Re-evaluated prior to induction Oxygen Delivery Method: Circle system utilized Preoxygenation: Pre-oxygenation with 100% oxygen Induction Type: IV induction Ventilation: Mask ventilation without difficulty Laryngoscope Size: 3 and McGraph Grade View: Grade I Tube type: Oral Tube size: 7.0 mm Number of attempts: 1 Airway Equipment and Method: Stylet and Video-laryngoscopy Placement Confirmation: ETT inserted through vocal cords under direct vision, positive ETCO2 and breath sounds checked- equal and bilateral Secured at: 21 cm Tube secured with: Tape Dental Injury: Teeth and Oropharynx as per pre-operative assessment  Difficulty Due To: Difficult Airway- due to dentition

## 2021-05-17 NOTE — Op Note (Signed)
[name], Carney MEDICAL RECORD NO: [id number] ACCOUNT NO: [id number] DATE OF BIRTH: 1976/10/29 FACILITY: [facility] LOCATION: [facility] PHYSICIAN: Suzy Bouchard, MD  Operative Report   DATE OF PROCEDURE: 05/17/2021  PREOPERATIVE DIAGNOSES: 1.  Acute right-sided pelvic pain. 2.  Complex ovarian cyst.  POSTOPERATIVE DIAGNOSES: 1.  Acute right-sided pelvic pain. 2.  Complex ovarian cyst. 3.  Right ovarian torsion.  PROCEDURE:  Laparoscopic right salpingo-oophorectomy.  SURGEON:  Suzy Bouchard, MD  FIRST ASSISTANT:  Christeen Douglas, MD  ANESTHESIA:  General endotracheal anesthesia.  INDICATIONS:  This 45 year old patient presented to the emergency department 2 days prior to the surgery date.  The patient was seen in the emergency department with acute onset of right lower quadrant pain and CT scan and ultrasound demonstrated a right  ovarian cyst, complex in nature.  The patient had a prior history of significant ovarian cyst and is status post a left salpingo-oophorectomy and hysterectomy years ago.  DESCRIPTION OF PROCEDURE:  After adequate general endotracheal anesthesia, the patient was placed in dorsal supine position, with the legs were placed in the [place] stirrups.  The patient's abdomen, perineum and vagina were prepped and draped in normal  sterile fashion.  Timeout was performed.  Straight catheterization of the bladder yielded 100 mL urine.  A sponge stick was placed in the vagina to be used for ovarian manipulation during the procedure.  Gloves and gown were changed.  Attention was  directed to the patient's abdomen where a 5 mm infraumbilical incision was made after injection with 0.5% Marcaine.  The 5 mm laparoscope was advanced into the abdominal cavity under direct visualization with the Optiview cannula.  The patient's abdomen  was insufflated.  Second port site was placed in the right lower quadrant, 3 cm medial to the right anterior iliac spine and  under direct visualization, 5 mm trocar was advanced.  A third port site was placed in the left lower quadrant, again 3 cm medial  to the left anterior iliac spine.  An 11 mm trocar was advanced under direct visualization.  Initial impression revealed a right multilobed ovary with cyst, total ovary length 5 x 4 cm.  The infundibulopelvic ligament was noted to be twisted and torsed  approximately 360 degrees.  Pictures were taken.  The infundibulopelvic ligament was then cauterized with the Kleppinger cautery after identifying normal peristaltic activity of the right ureter.  Harmonic scalpel was used to remove the right fallopian  tube and ovary.  The Endobag was brought up to the operative field.  The ovary and attenuated fallopian tube were then placed into the Endobag and were brought through the left lower port site without difficulty.  The pelvis and left sidewall appeared  normal. Intra-abdominal pressure was lowered to 7 mmHg.  Good hemostasis noted of the right ovarian pedicle.  The patient's abdomen was then deflated.  The Joost-Thomason cone was then placed through the left lower port site and the fascia was then  closed with a 2-0 Vicryl suture.  Good approximation of edges noted and the patient's abdomen was then deflated and all incisional sites were closed with interrupted 4-0 Vicryl suture.  INTRAOPERATIVE FLUIDS:  800 mL.  ESTIMATED BLOOD LOSS:  10 mL.  URINE OUTPUT:  100 mL.  There were no complications.  The patient was taken to recovery room in good condition.   SHW D: 05/17/2021 7:33:04 pm T: 05/17/2021 8:35:00 pm  JOB: [id number]/ [id number]

## 2021-05-17 NOTE — Transfer of Care (Signed)
Immediate Anesthesia Transfer of Care Note  Patient: Deborah Carney  Procedure(s) Performed: LAPAROSCOPIC UNILATERAL SALPINGOOPHERECTOMY (Right)  Patient Location: PACU  Anesthesia Type:General  Level of Consciousness: sedated  Airway & Oxygen Therapy: Patient Spontanous Breathing and Patient connected to face mask oxygen  Post-op Assessment: Report given to RN and Post -op Vital signs reviewed and stable  Post vital signs: Reviewed and stable  Last Vitals:  Vitals Value Taken Time  BP 169/90   Temp    Pulse 93   Resp 19 05/17/21 1914  SpO2 99   Vitals shown include unvalidated device data.  Last Pain: There were no vitals filed for this visit.       Complications: No notable events documented.

## 2021-05-17 NOTE — Discharge Instructions (Signed)

## 2021-05-17 NOTE — Progress Notes (Signed)
Pt ready for l/s RSO for complex ovarian cyst ( right ) and acute onset pelvic pain .  Labs reviewed . All questions answered . Proceed

## 2021-05-17 NOTE — Anesthesia Preprocedure Evaluation (Addendum)
Anesthesia Evaluation  Patient identified by MRN, date of birth, ID band Patient awake    Reviewed: Allergy & Precautions, NPO status , Patient's Chart, lab work & pertinent test results  History of Anesthesia Complications Negative for: history of anesthetic complications  Airway Mallampati: II  TM Distance: >3 FB Neck ROM: Full    Dental no notable dental hx. (+) Poor Dentition, Chipped, Missing   Pulmonary neg sleep apnea, neg COPD, Current SmokerPatient did not abstain from smoking., former smoker,    breath sounds clear to auscultation- rhonchi (-) wheezing      Cardiovascular Exercise Tolerance: Good (-) hypertension(-) CAD, (-) Past MI, (-) Cardiac Stents and (-) CABG  Rhythm:Regular Rate:Normal - Systolic murmurs and - Diastolic murmurs    Neuro/Psych neg Seizures negative neurological ROS  negative psych ROS   GI/Hepatic negative GI ROS, Neg liver ROS,   Endo/Other  negative endocrine ROSneg diabetes  Renal/GU negative Renal ROS     Musculoskeletal negative musculoskeletal ROS (+)   Abdominal (+) - obese,   Peds  Hematology negative hematology ROS (+)   Anesthesia Other Findings Past Medical History: No date: Medical history non-contributory    Reproductive/Obstetrics                            Anesthesia Physical Anesthesia Plan  ASA: 2  Anesthesia Plan: General   Post-op Pain Management:    Induction: Intravenous  PONV Risk Score and Plan: 1 and Ondansetron, Dexamethasone and Midazolam  Airway Management Planned: Oral ETT  Additional Equipment:   Intra-op Plan:   Post-operative Plan: Extubation in OR  Informed Consent: I have reviewed the patients History and Physical, chart, labs and discussed the procedure including the risks, benefits and alternatives for the proposed anesthesia with the patient or authorized representative who has indicated his/her  understanding and acceptance.     Dental advisory given  Plan Discussed with: CRNA and Anesthesiologist  Anesthesia Plan Comments:         Anesthesia Quick Evaluation

## 2021-05-18 ENCOUNTER — Encounter: Payer: Self-pay | Admitting: Obstetrics and Gynecology

## 2021-05-18 NOTE — Anesthesia Postprocedure Evaluation (Signed)
Anesthesia Post Note  Patient: Deborah Carney  Procedure(s) Performed: LAPAROSCOPIC UNILATERAL SALPINGOOPHERECTOMY (Right)  Patient location during evaluation: PACU Anesthesia Type: General Level of consciousness: awake and alert Pain management: pain level controlled Vital Signs Assessment: post-procedure vital signs reviewed and stable Respiratory status: spontaneous breathing, nonlabored ventilation, respiratory function stable and patient connected to nasal cannula oxygen Cardiovascular status: blood pressure returned to baseline and stable Postop Assessment: no apparent nausea or vomiting Anesthetic complications: no   No notable events documented.   Last Vitals:  Vitals:   05/17/21 2035 05/17/21 2040  BP:  128/82  Pulse: 93 92  Resp: 12 16  Temp: 36.4 C   SpO2: 93% 94%    Last Pain:  Vitals:   05/17/21 2040  PainSc: 2                  Cleda Mccreedy Donyetta Ogletree

## 2021-05-20 LAB — POCT PREGNANCY, URINE: Preg Test, Ur: NEGATIVE

## 2021-05-21 LAB — SURGICAL PATHOLOGY

## 2022-02-01 ENCOUNTER — Encounter (INDEPENDENT_AMBULATORY_CARE_PROVIDER_SITE_OTHER): Payer: Self-pay

## 2022-02-05 ENCOUNTER — Ambulatory Visit: Payer: Self-pay | Admitting: Physician Assistant

## 2022-02-05 NOTE — Progress Notes (Deleted)
? ?I,Ryden Wainer D Dewanda Fennema,acting as a scribe for [employer], PA-C.,have documented all relevant documentation on the behalf of Deborah Lat, PA-C,as directed by  [employer], PA-C while in the presence of [employer], PA-C. ? ? ?Established Patient Office Visit ? ?Subjective:  ?Patient ID: Deborah Carney, female    DOB: 12-Dec-1975  Age: 46 y.o. MRN: [id number] ? ?CC: No chief complaint on file. ? ? ?HPI ?[name] presents to establish care and possibly get referral to neurology. ? ?Past Medical History:  ?Diagnosis Date  ? Medical history non-contributory   ? ? ?Past Surgical History:  ?Procedure Laterality Date  ? LAPAROSCOPIC UNILATERAL SALPINGECTOMY Right 05/17/2021  ? Procedure: LAPAROSCOPIC UNILATERAL SALPINGOOPHERECTOMY;  Surgeon: Schermerhorn, Ihor Austin, MD;  Location: [facility];  Service: Gynecology;  Laterality: Right;  ? OOPHORECTOMY Left   ? PARTIAL HYSTERECTOMY    ? ? ?Family History  ?Problem Relation Age of Onset  ? CAD Mother   ? Diabetes Mother   ? Lung cancer Father   ? CVA Father   ? COPD Father   ? ? ?Social History  ? ?Socioeconomic History  ? Marital status: Married  ?  Spouse name: Not on file  ? Number of children: Not on file  ? Years of education: Not on file  ? Highest education level: Not on file  ?Occupational History  ? Not on file  ?Tobacco Use  ? Smoking status: Former  ?  Types: Cigarettes  ?  Quit date: 2014  ?  Years since quitting: 9.2  ? Smokeless tobacco: Never  ?Vaping Use  ? Vaping Use: Never used  ?Substance and Sexual Activity  ? Alcohol use: No  ? Drug use: No  ? Sexual activity: Not on file  ?Other Topics Concern  ? Not on file  ?Social History Narrative  ? Not on file  ? ?Social Determinants of Health  ? ?Financial Resource Strain: Not on file  ?Food Insecurity: Not on file  ?Transportation Needs: Not on file  ?Physical Activity: Not on file  ?Stress: Not on file  ?Social Connections: Not on file  ?Intimate Partner Violence: Not on file  ? ? ?Outpatient Medications  Prior to Visit  ?Medication Sig Dispense Refill  ? albuterol (PROVENTIL HFA;VENTOLIN HFA) 108 (90 Base) MCG/ACT inhaler Inhale 2-4 puffs by mouth every 4 hours as needed for wheezing, cough, and/or shortness of breath 1 Inhaler 1  ? cyclobenzaprine (FLEXERIL) 5 MG tablet Take 1 tablet (5 mg total) by mouth 3 (three) times daily. 12 tablet 0  ? oxyCODONE-acetaminophen (PERCOCET) 5-325 MG tablet Take 1-2 tablets by mouth every 4 (four) hours as needed for severe pain (Do not exceed 8 pills in 24 hours). 20 tablet 0  ? sertraline (ZOLOFT) 100 MG tablet Take 100 mg by mouth daily.    ? ?No facility-administered medications prior to visit.  ? ? ?No Known Allergies ? ?ROS ?Review of Systems  ?Constitutional: Negative.   ?HENT: Negative.    ?Eyes: Negative.   ?Respiratory: Negative.    ?Cardiovascular: Negative.   ?Gastrointestinal: Negative.   ?Endocrine: Negative.   ?Genitourinary: Negative.   ?Musculoskeletal: Negative.   ?Skin: Negative.   ?Allergic/Immunologic: Negative.   ?Neurological: Negative.   ?Hematological: Negative.   ?Psychiatric/Behavioral: Negative.    ? ?  ?Objective:  ?  ?Physical Exam ? ?There were no vitals taken for this visit. ?Wt Readings from Last 3 Encounters:  ?05/15/21 138 lb 0.1 oz (62.6 kg)  ?01/05/19 138 lb (62.6 kg)  ?  09/20/17 158 lb 12.8 oz (72 kg)  ? ? ? ?Health Maintenance Due  ?Topic Date Due  ? COVID-19 Vaccine (1) Never done  ? Hepatitis C Screening  Never done  ? PAP SMEAR-Modifier  Never done  ? COLONOSCOPY (Pts 45-11yrs Insurance coverage will need to be confirmed)  Never done  ? ? ?There are no preventive care reminders to display for this patient. ? ?No results found for: TSH ?Lab Results  ?Component Value Date  ? WBC 10.6 (H) 05/17/2021  ? HGB 14.7 05/17/2021  ? HCT 42.5 05/17/2021  ? MCV 95.5 05/17/2021  ? PLT 349 05/17/2021  ? ?Lab Results  ?Component Value Date  ? NA 136 05/17/2021  ? K 3.7 05/17/2021  ? CO2 26 05/17/2021  ? GLUCOSE 97 05/17/2021  ? BUN 5 (L) 05/17/2021  ?  CREATININE 0.44 05/17/2021  ? BILITOT 0.7 05/15/2021  ? ALKPHOS 77 05/15/2021  ? AST 24 05/15/2021  ? ALT 20 05/15/2021  ? PROT 7.7 05/15/2021  ? ALBUMIN 4.2 05/15/2021  ? CALCIUM 8.9 05/17/2021  ? ANIONGAP 8 05/17/2021  ? ?No results found for: CHOL ?No results found for: HDL ?No results found for: LDLCALC ?No results found for: TRIG ?No results found for: CHOLHDL ?No results found for: HGBA1C ? ?  ?Assessment & Plan:  ? ?Problem List Items Addressed This Visit   ?None ? ? ?No orders of the defined types were placed in this encounter. ? ? ?Follow-up: No follow-ups on file.  ? ? ?Adline Peals, CMA ?

## 2023-01-23 IMAGING — US US PELVIS COMPLETE TRANSABD/TRANSVAG W DUPLEX
1 series · 14 of 25 positions shown · non-contrast
Comparison: CT abdomen and pelvis 05/15/2021

CLINICAL DATA: Pelvic pain for 1 day, LMP more than 2 years ago

EXAM:
TRANSABDOMINAL AND TRANSVAGINAL ULTRASOUND OF PELVIS
DOPPLER ULTRASOUND OF OVARIES
TECHNIQUE: Both transabdominal and transvaginal ultrasound examinations of the
pelvis were performed. Transabdominal technique was performed for
global imaging of the pelvis including uterus, ovaries, adnexal
regions, and pelvic cul-de-sac.
It was necessary to proceed with endovaginal exam following the
transabdominal exam to visualize the ovaries. Color and duplex
Doppler ultrasound was utilized to evaluate blood flow to the
ovaries.

[Series 1: us pelvis complete · 119 acquisitions, 14 frames shown]
[im 1/119]
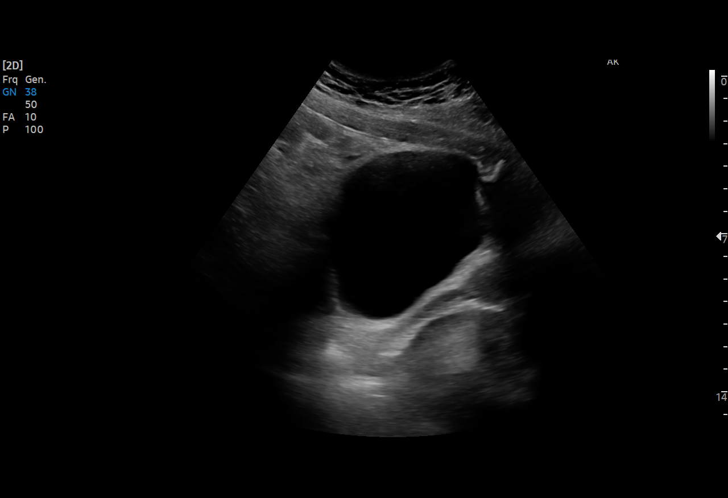
[im 10/119]
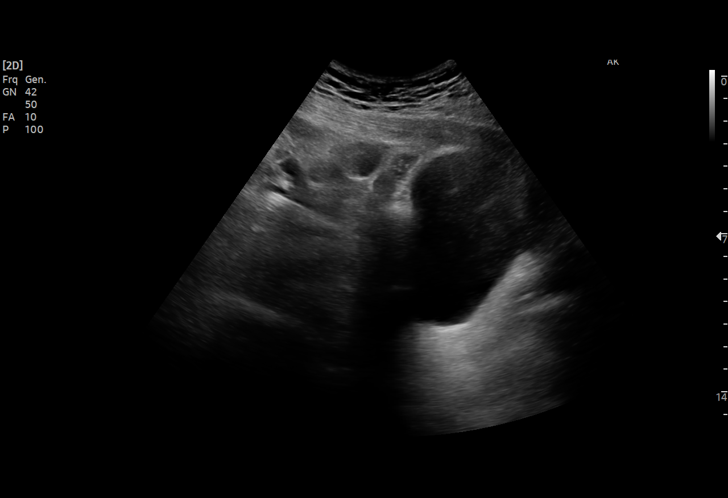
[im 20/119]
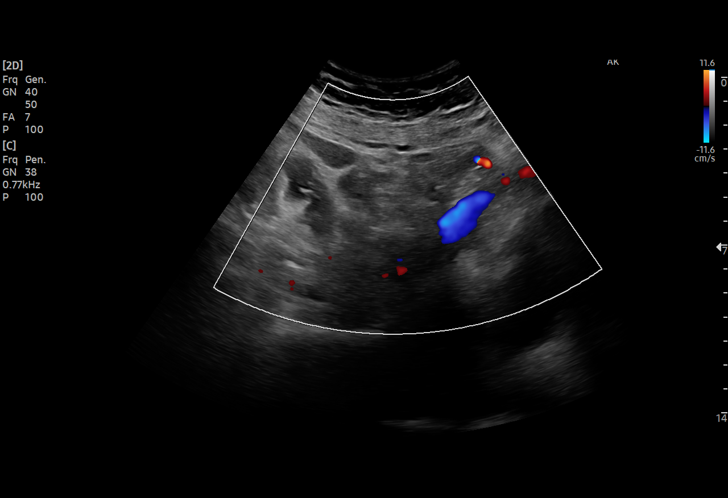
[im 30/119]
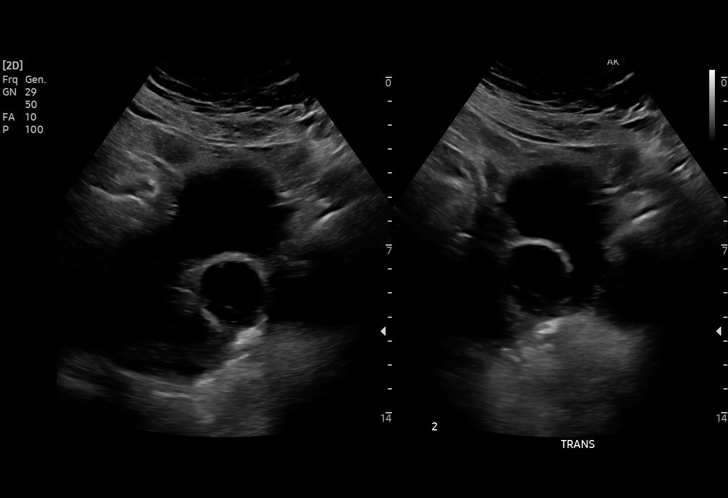
[im 40/119]
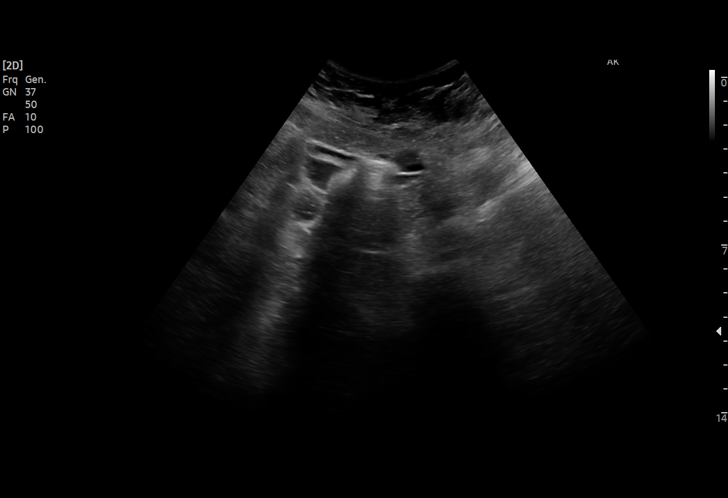
[im 45/119]
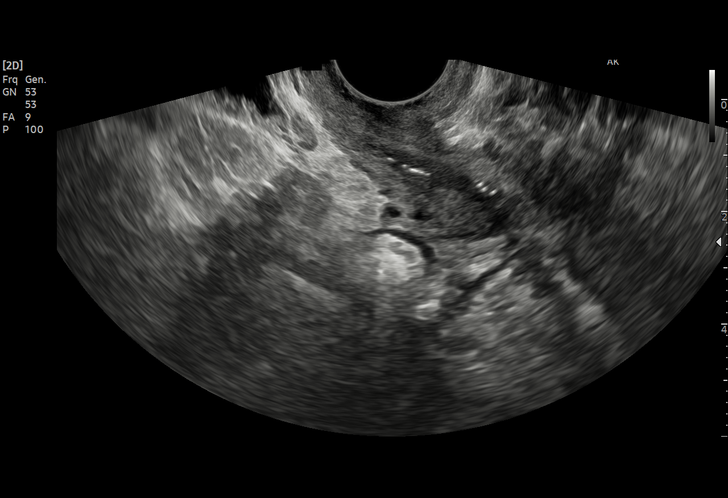
[im 55/119]
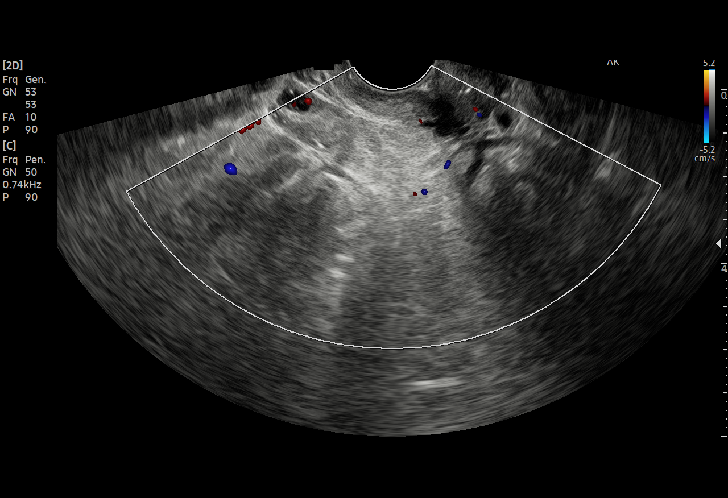
[im 64/119]
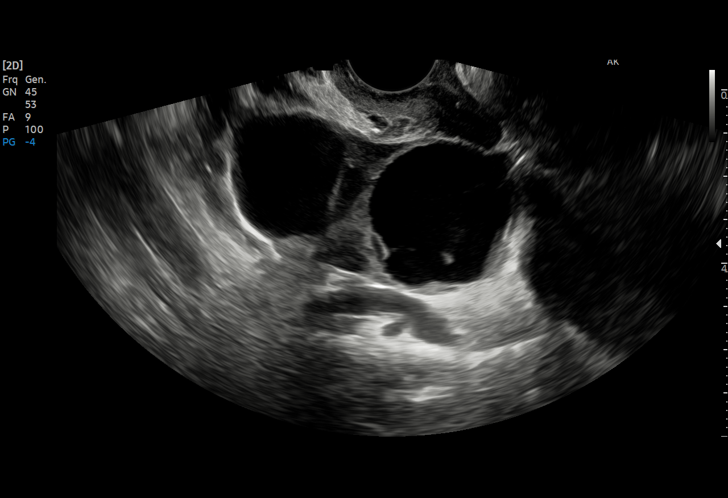
[im 74/119]
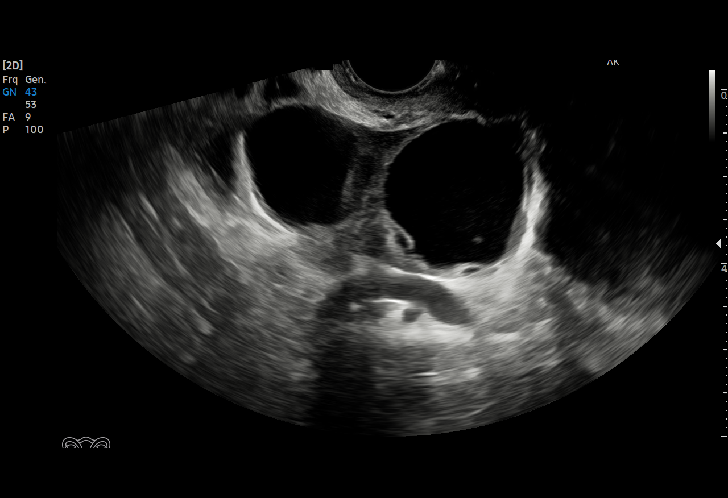
[im 79/119]
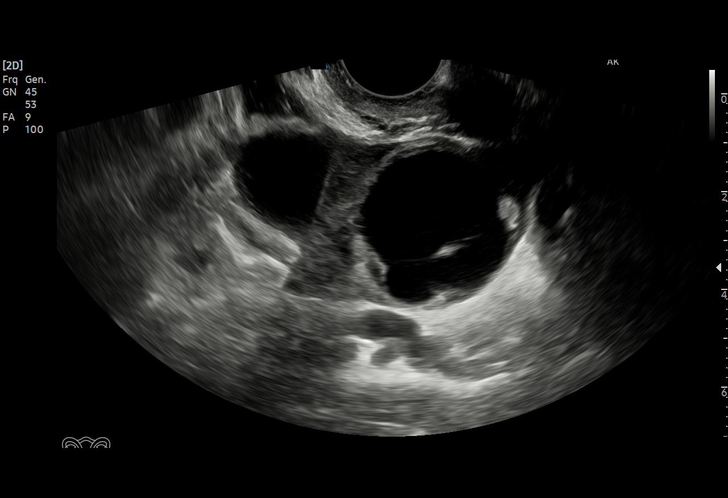
[im 89/119]
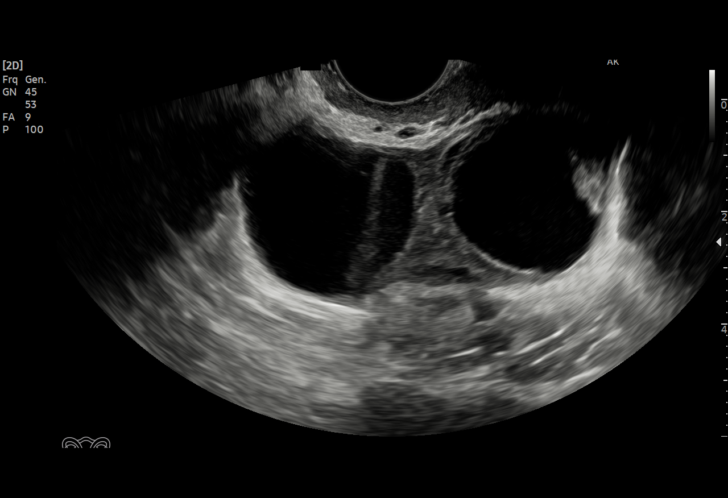
[im 99/119]
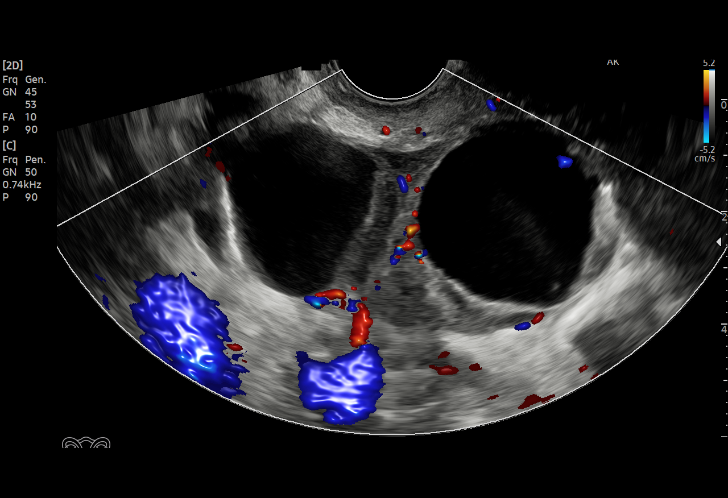
[im 109/119]
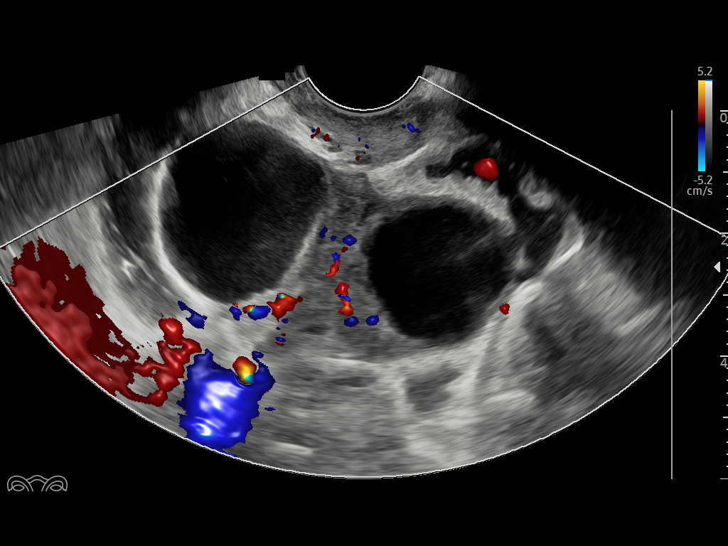
[im 119/119]
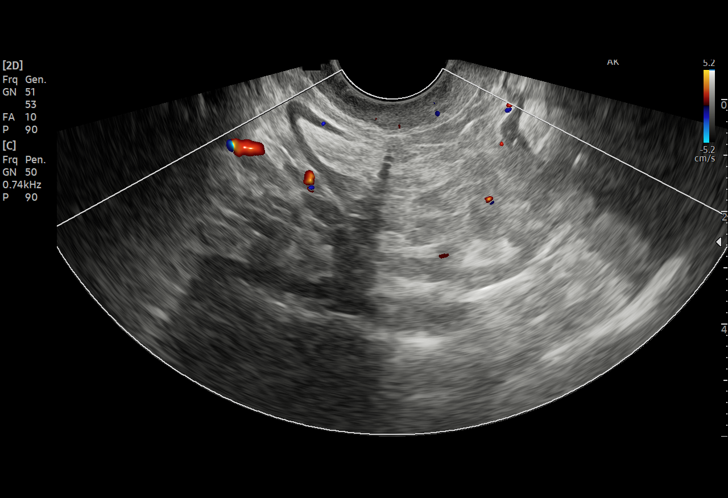

[14 of 25 positions shown; findings below may reference images not displayed]

FINDINGS: Uterus

Surgically absent

Endometrium

Surgically absent

Right ovary

Measurements: 5.9 x 3.1 x 6.3 cm = volume: 60 mL. Enlarged by a
complex cystic lesion, with 2 dominant components which measure
x 3.5 x 3.6 cm and 3.1 x 2.7 x 2.5 cm. The smaller cystic lesion has
a dependent fluid debris level and scattered internal echoes as well
as a single thick septation. The larger cystic lesion contains
partial septations which are irregular and thick as well as a mural
nodule 8 x 6 mm in size. Cystic ovarian neoplasm not excluded. Blood
flow present within RIGHT ovary on color Doppler imaging.

Left ovary

Not visualized, patient states surgically absent

Pulsed Doppler evaluation demonstrates normal low-resistance
arterial and venous waveforms within RIGHT ovary.

Other findings

No free pelvic fluid.  No other pelvic masses.
IMPRESSION: Post hysterectomy and reportedly LEFT oophorectomy.

RIGHT ovary replaced by a complicated cystic lesion which contain
internal echogenicity, irregular septations, and a mural nodule 8 mm
diameter; cystic ovarian neoplasm not excluded and surgical
evaluation is recommended.

No evidence of RIGHT ovarian torsion.

## 2024-07-27 NOTE — Progress Notes (Signed)
 JENAH VANASTEN                                          MRN: [id number]   07/27/2024   The [facility] Quality Team Specialist reviewed this patient medical record for the purposes of chart review for care gap closure. The following were reviewed: chart review for care gap closure-colorectal cancer screening.    [facility] Quality Team
